# Patient Record
Sex: Female | Born: 1995 | Race: Black or African American | Hispanic: No | Marital: Single | Smoking: Never smoker
Health system: Southern US, Community
[De-identification: ages and names within clinical notes are randomized; demographics above are authoritative.]

## PROBLEM LIST (undated history)

## (undated) DIAGNOSIS — F329 Major depressive disorder, single episode, unspecified: Secondary | ICD-10-CM

## (undated) DIAGNOSIS — F32A Depression, unspecified: Secondary | ICD-10-CM

## (undated) DIAGNOSIS — F909 Attention-deficit hyperactivity disorder, unspecified type: Secondary | ICD-10-CM

## (undated) DIAGNOSIS — F840 Autistic disorder: Secondary | ICD-10-CM

## (undated) DIAGNOSIS — F419 Anxiety disorder, unspecified: Secondary | ICD-10-CM

## (undated) HISTORY — DX: Depression, unspecified: F32.A

## (undated) HISTORY — DX: Autistic disorder: F84.0

## (undated) HISTORY — DX: Major depressive disorder, single episode, unspecified: F32.9

## (undated) HISTORY — DX: Attention-deficit hyperactivity disorder, unspecified type: F90.9

---
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
## 2014-07-02 ENCOUNTER — Emergency Department (HOSPITAL_COMMUNITY): Payer: Medicaid Other

## 2014-07-02 ENCOUNTER — Encounter (HOSPITAL_COMMUNITY): Payer: Self-pay | Admitting: Emergency Medicine

## 2014-07-02 ENCOUNTER — Emergency Department (HOSPITAL_COMMUNITY)
Admission: EM | Admit: 2014-07-02 | Discharge: 2014-07-02 | Disposition: A | Discharge status: Home / Self Care | Payer: Medicaid Other | Attending: Emergency Medicine | Admitting: Emergency Medicine

## 2014-07-02 DIAGNOSIS — F411 Generalized anxiety disorder: Secondary | ICD-10-CM | POA: Insufficient documentation

## 2014-07-02 DIAGNOSIS — IMO0002 Reserved for concepts with insufficient information to code with codable children: Secondary | ICD-10-CM | POA: Insufficient documentation

## 2014-07-02 DIAGNOSIS — Y9389 Activity, other specified: Secondary | ICD-10-CM | POA: Insufficient documentation

## 2014-07-02 DIAGNOSIS — Z79899 Other long term (current) drug therapy: Secondary | ICD-10-CM | POA: Diagnosis not present

## 2014-07-02 DIAGNOSIS — Y929 Unspecified place or not applicable: Secondary | ICD-10-CM | POA: Insufficient documentation

## 2014-07-02 DIAGNOSIS — W010XXA Fall on same level from slipping, tripping and stumbling without subsequent striking against object, initial encounter: Secondary | ICD-10-CM | POA: Insufficient documentation

## 2014-07-02 DIAGNOSIS — X500XXA Overexertion from strenuous movement or load, initial encounter: Secondary | ICD-10-CM | POA: Insufficient documentation

## 2014-07-02 DIAGNOSIS — S99922A Unspecified injury of left foot, initial encounter: Secondary | ICD-10-CM

## 2014-07-02 DIAGNOSIS — S90129A Contusion of unspecified lesser toe(s) without damage to nail, initial encounter: Secondary | ICD-10-CM | POA: Insufficient documentation

## 2014-07-02 DIAGNOSIS — S8990XA Unspecified injury of unspecified lower leg, initial encounter: Secondary | ICD-10-CM | POA: Diagnosis present

## 2014-07-02 MED ORDER — IBUPROFEN 600 MG PO TABS: 600.0000 mg | ORAL_TABLET | Freq: Four times a day (QID) | ORAL | Status: DC | PRN

## 2014-07-02 MED ORDER — OXYCODONE-ACETAMINOPHEN 5-325 MG PO TABS
1.0000 | ORAL_TABLET | Freq: Once | ORAL | Status: AC
  Administered 2014-07-02: 1 via ORAL
  Filled 2014-07-02: qty 1

## 2014-07-02 NOTE — ED Notes (Signed)
Reports tripping and falling last night and "twisted" right foot/big toe. Bruising and swelling noted. Pt ambulatory at triage.

## 2014-07-02 NOTE — ED Provider Notes (Signed)
CSN: [id number]     Arrival date & time 07/02/14  1316 History   This chart was scribed for non-physician practitioner working with Lyanne CoKevin M Campos, MD, by Andrew Auaven Small, ED Scribe. This patient was seen in room TR07C/TR07C and the patient's care was started at 1:27 PM.  Chief Complaint  Patient presents with  . Foot Injury   HPI Comments: Sylvia [name] is a 18 y.o. female who presents to the Emergency Department complaining of right foot pain. Pt states she tripped and twisted her foot last night. Denies other injury, blow to head or LOC. Pt has taken tylenol and ibuprofen without relief to pain.  Reports increase discomfort with ambulation. Pt denies numbness, tingling and weakness to foot. She denies fever and chills.   Patient is a 18 y.o. female presenting with foot injury. The history is provided by the patient. No language interpreter was used.  Foot Injury Associated symptoms: no fever     Past Medical History  Diagnosis Date  . Anxiety    Past Surgical History  Procedure Laterality Date  . Eye surgery     Family History  Problem Relation Age of Onset  . Heart attack Maternal Grandmother   . Lung cancer Paternal Grandmother    History  Substance Use Topics  . Smoking status: Never Smoker   . Smokeless tobacco: Never Used  . Alcohol Use: No   OB History   Grav Para Term Preterm Abortions TAB SAB Ect Mult Living                 Review of Systems  Constitutional: Negative for fever and chills.  Musculoskeletal: Positive for arthralgias, gait problem and myalgias.  Skin: Positive for color change. Negative for wound.  Neurological: Negative for weakness and numbness.  All other systems reviewed and are negative.   Allergies  Review of patient's allergies indicates no known allergies.  Home Medications   Prior to Admission medications   Medication Sig Start Date End Date Taking? Authorizing Provider  cetirizine (ZYRTEC) 10 MG tablet Take 10 mg by mouth daily.     Historical Provider, MD  cyclobenzaprine (FLEXERIL) 10 MG tablet Take 1 tablet (10 mg total) by mouth 2 (two) times daily as needed for muscle spasms. 02/04/13   Chrystine Oileross J Kuhner, MD  escitalopram (LEXAPRO) 20 MG tablet Take 20 mg by mouth daily.    Historical Provider, MD  ibuprofen (ADVIL,MOTRIN) 200 MG tablet Take 600 mg by mouth every 6 (six) hours as needed for pain (for back pain.).    Historical Provider, MD  Multiple Vitamin (MULTIVITAMIN) tablet Take 1 tablet by mouth daily.    Historical Provider, MD  Olopatadine HCl (PATANASE) 0.6 % SOLN Place into the nose.    Historical Provider, MD  triamcinolone (NASACORT) 55 MCG/ACT nasal inhaler Place 2 sprays into the nose daily.    Historical Provider, MD   BP 108/72  Pulse 93  Temp(Src) 98.5 F (36.9 C) (Oral)  Resp 18  SpO2 99%  LMP 06/25/2014 Physical Exam  Nursing note and vitals reviewed. Constitutional: She is oriented to person, place, and time. She appears well-developed and well-nourished.  Non-toxic appearance. She does not have a sickly appearance. She does not appear ill. No distress.  HENT:  Head: Normocephalic and atraumatic.  Neck: Normal range of motion. Neck supple.  Pulmonary/Chest: Effort normal. No respiratory distress.  Musculoskeletal:       Left ankle: She exhibits swelling and ecchymosis. She exhibits normal range of  motion, no deformity, no laceration and normal pulse. No lateral malleolus, no medial malleolus and no AITFL tenderness found. Achilles tendon normal.       Feet:  Minimal ecchymosis to in between the base of 1st and 2nd toes.  Neurological: She is alert and oriented to person, place, and time.  Skin: Skin is warm and dry. She is not diaphoretic.  Psychiatric: She has a normal mood and affect. Her behavior is normal.    ED Course  Procedures  Labs Review Labs Reviewed - No data to display  Imaging Review Dg Foot Complete Left  07/02/2014   CLINICAL DATA:  Pain post trauma  EXAM: LEFT FOOT -  COMPLETE 3+ VIEW  COMPARISON:  None.  FINDINGS: Frontal, oblique, and lateral views were obtained. There is no fracture or dislocation. Joint spaces appear intact. No erosive change.  IMPRESSION: No abnormality noted.   Electronically Signed   By: Bretta BangWilliam  Woodruff M.D.   On: 07/02/2014 13:54     EKG Interpretation None      MDM   Final diagnoses:  Foot injury, left, initial encounter   Pt present with injury to left foot.  Negative XR, N/V intact. RICE therapy, post-op shoe and crutches ordered. Discussed imaging results, and treatment plan with the patient and patient's mother. Return precautions given. Reports understanding and no other concerns at this time.  Patient is stable for discharge at this time.  Meds given in ED:  Medications  oxyCODONE-acetaminophen (PERCOCET/ROXICET) 5-325 MG per tablet 1 tablet (1 tablet Oral Given 07/02/14 1400)    Discharge Medication List as of 07/02/2014  2:54 PM    START taking these medications   Details  ibuprofen (ADVIL,MOTRIN) 600 MG tablet Take 1 tablet (600 mg total) by mouth every 6 (six) hours as needed., Starting 07/02/2014, Until Discontinued, Print       I personally performed the services described in this documentation, which was scribed in my presence. The recorded information has been reviewed and is accurate.      Clabe SealLauren M Camerin Jimenez, PA-C 07/02/14 1742  Clabe SealLauren M Anayely Constantine, PA-C 07/02/14 [phone]

## 2014-07-02 NOTE — Discharge Instructions (Signed)
Call for a follow up appointment with a Family or Primary Care Provider.  Ice your foot 3-4 times a day and elevate it above your heart when you're not walking or standing. Call a orthopedist specialist if you have persistent pain in your foot. Use crutches as needed. Return if Symptoms worsen.   Take medication as prescribed.

## 2014-07-03 NOTE — ED Provider Notes (Signed)
Medical screening examination/treatment/procedure(s) were performed by non-physician practitioner and as supervising physician I was immediately available for consultation/collaboration.   EKG Interpretation None        Lyanne CoKevin M Tacara Hadlock, MD 07/03/14 [phone]

## 2015-07-03 ENCOUNTER — Emergency Department (HOSPITAL_COMMUNITY)
Admission: EM | Admit: 2015-07-03 | Discharge: 2015-07-03 | Disposition: A | Discharge status: Home / Self Care | Payer: Medicaid Other | Attending: Emergency Medicine | Admitting: Emergency Medicine

## 2015-07-03 ENCOUNTER — Encounter (HOSPITAL_COMMUNITY): Payer: Self-pay | Admitting: *Deleted

## 2015-07-03 DIAGNOSIS — L739 Follicular disorder, unspecified: Secondary | ICD-10-CM

## 2015-07-03 DIAGNOSIS — Z79899 Other long term (current) drug therapy: Secondary | ICD-10-CM | POA: Diagnosis not present

## 2015-07-03 DIAGNOSIS — F419 Anxiety disorder, unspecified: Secondary | ICD-10-CM | POA: Diagnosis not present

## 2015-07-03 DIAGNOSIS — Z7951 Long term (current) use of inhaled steroids: Secondary | ICD-10-CM | POA: Diagnosis not present

## 2015-07-03 DIAGNOSIS — R21 Rash and other nonspecific skin eruption: Secondary | ICD-10-CM | POA: Diagnosis present

## 2015-07-03 MED ORDER — CLINDAMYCIN PHOSPHATE 1 % EX GEL: Freq: Two times a day (BID) | CUTANEOUS | Status: DC

## 2015-07-03 NOTE — ED Provider Notes (Signed)
CSN: [id number]     Arrival date & time 07/03/15  1113 History  This chart was scribed for Burna FortsJeff Evangelyn Crouse, PA-C, working with No att. providers found by Elon SpannerGarrett Cook, ED Scribe. This patient was seen in room TR10C/TR10C and the patient's care was started at 5:38 PM.   Chief Complaint  Patient presents with  . Rash   The history is provided by the patient. No language interpreter was used.   HPI Comments: Sylvia [name] is a 19 y.o. female who presents to the Emergency Department complaining of rash or lower extremities and arms. Patient reports that on Friday she shaved her legs, Saturday went to the pool. Patient reports developing pustules to her lower extremities hands and face. She reports they're not it she, not getting worse, not spreading. She denies involvement of the groin and abdomen back and minimal upper extremities. She no fever chills, nausea, vomiting, signs of systemic infection. Patient has not tried any over-the-counter therapies.  Past Medical History  Diagnosis Date  . Anxiety    Past Surgical History  Procedure Laterality Date  . Eye surgery     Family History  Problem Relation Age of Onset  . Heart attack Maternal Grandmother   . Lung cancer Paternal Grandmother    History  Substance Use Topics  . Smoking status: Never Smoker   . Smokeless tobacco: Never Used  . Alcohol Use: No   OB History    No data available     Review of Systems  Skin: Positive for rash.  All other systems reviewed and are negative.   Allergies  Review of patient's allergies indicates no known allergies.  Home Medications   Prior to Admission medications   Medication Sig Start Date End Date Taking? Authorizing Provider  cetirizine (ZYRTEC) 10 MG tablet Take 10 mg by mouth daily.    Historical Provider, MD  clindamycin (CLINDAGEL) 1 % gel Apply topically 2 (two) times daily. 07/03/15   Eyvonne MechanicJeffrey Sherea Liptak, PA-C  escitalopram (LEXAPRO) 20 MG tablet Take 20 mg by mouth at bedtime.      Historical Provider, MD  ibuprofen (ADVIL,MOTRIN) 600 MG tablet Take 1 tablet (600 mg total) by mouth every 6 (six) hours as needed. 07/02/14   Mellody DrownLauren Parker, PA-C  IRON PO Take 1 tablet by mouth 2 (two) times daily.    Historical Provider, MD  Multiple Vitamin (MULTIVITAMIN) tablet Take 1 tablet by mouth daily.    Historical Provider, MD  Olopatadine HCl (PATANASE) 0.6 % SOLN Place 1 puff into both nostrils 2 (two) times daily as needed (congestion).     Historical Provider, MD  triamcinolone (NASACORT) 55 MCG/ACT nasal inhaler Place 2 sprays into both nostrils daily.     Historical Provider, MD   BP 108/63 mmHg  Pulse 63  Temp(Src) 98.4 F (36.9 C) (Oral)  Resp 14  SpO2 100%  LMP 07/03/2015    Physical Exam  Constitutional: She is oriented to person, place, and time. She appears well-developed and well-nourished. No distress.  HENT:  Head: Normocephalic and atraumatic.  Mouth/Throat: Uvula is midline. Normal dentition. No oropharyngeal exudate, posterior oropharyngeal edema, posterior oropharyngeal erythema or tonsillar abscesses.  Eyes: Conjunctivae and EOM are normal.  Neck: Neck supple. No tracheal deviation present.  Cardiovascular: Normal rate.   Pulmonary/Chest: Effort normal. No respiratory distress.  Musculoskeletal: Normal range of motion.  Neurological: She is alert and oriented to person, place, and time.  Skin: Skin is warm and dry.  Multiple pustules to the lower  extremities greater on the anterior surface sparing the bottom of the feet, digital spaces. There are a few pustules to the upper extremities and hands no involvement of the palms. Face has several pustules. No signs of spreading infection.  Psychiatric: She has a normal mood and affect. Her behavior is normal.  Nursing note and vitals reviewed.   ED Course  Procedures (including critical care time)  DIAGNOSTIC STUDIES:   COORDINATION OF CARE:    Labs Review Labs Reviewed - No data to  display  Imaging Review No results found.   EKG Interpretation None      MDM   Final diagnoses:  Folliculitis    Labs:  Imaging:  Therapeutics:  Assessment/Plan: Patient presents with likely folliculitis. Patient has no systemic symptoms, no extensive involvement. She will be placed on topical antibiotic therapy, and instructed to use Benadryl as needed for itching. Strict return precautions given, encouraged to follow up with primary care in 3 days if symptoms do not improve. Patient verbalized understanding and agreement to today's plan, patient's mother was present at the time of evaluation, she also understood and agreed to today's plan. No further questions or concerns at the time of discharge  Discharge Meds: Clindamycin 1% gel  I personally performed the services described in this documentation, which was scribed in my presence. The recorded information has been reviewed and is accurate.    Eyvonne Mechanic, PA-C 07/03/15 1738  Bethann Berkshire, MD 07/04/15 [phone]

## 2015-07-03 NOTE — ED Notes (Signed)
Pt has rash and itching to legs, arms and face.

## 2015-07-03 NOTE — Discharge Instructions (Signed)
Please use topical antibiotic on affected areas twice daily for 7 days. Please follow up with her primary care provider if symptoms persist beyond that. Monitor or for signs of infection return immediately

## 2017-05-19 ENCOUNTER — Ambulatory Visit (HOSPITAL_COMMUNITY)
Admission: EM | Admit: 2017-05-19 | Discharge: 2017-05-19 | Disposition: A | Discharge status: Home / Self Care | Payer: Medicaid Other | Attending: Internal Medicine | Admitting: Internal Medicine

## 2017-05-19 ENCOUNTER — Encounter (HOSPITAL_COMMUNITY): Payer: Self-pay | Admitting: *Deleted

## 2017-05-19 DIAGNOSIS — R42 Dizziness and giddiness: Secondary | ICD-10-CM | POA: Diagnosis not present

## 2017-05-19 DIAGNOSIS — Z3202 Encounter for pregnancy test, result negative: Secondary | ICD-10-CM | POA: Diagnosis not present

## 2017-05-19 LAB — POCT URINALYSIS DIP (DEVICE)
Bilirubin Urine: NEGATIVE
GLUCOSE, UA: NEGATIVE mg/dL
Hgb urine dipstick: NEGATIVE
Nitrite: NEGATIVE
Protein, ur: NEGATIVE mg/dL
Specific Gravity, Urine: 1.025 (ref 1.005–1.030)
UROBILINOGEN UA: 0.2 mg/dL (ref 0.0–1.0)
pH: 5.5 (ref 5.0–8.0)

## 2017-05-19 LAB — POCT I-STAT, CHEM 8
BUN: 17 mg/dL (ref 6–20)
CALCIUM ION: 1.27 mmol/L (ref 1.15–1.40)
CHLORIDE: 104 mmol/L (ref 101–111)
Creatinine, Ser: 0.8 mg/dL (ref 0.44–1.00)
GLUCOSE: 53 mg/dL — AB (ref 65–99)
HCT: 42 % (ref 36.0–46.0)
HEMOGLOBIN: 14.3 g/dL (ref 12.0–15.0)
Potassium: 3.7 mmol/L (ref 3.5–5.1)
Sodium: 139 mmol/L (ref 135–145)
TCO2: 27 mmol/L (ref 0–100)

## 2017-05-19 LAB — POCT PREGNANCY, URINE: PREG TEST UR: NEGATIVE

## 2017-05-19 MED ORDER — MECLIZINE HCL 25 MG PO TABS: 25.0000 mg | ORAL_TABLET | Freq: Three times a day (TID) | ORAL | 0 refills | Status: DC | PRN

## 2017-05-19 NOTE — Discharge Instructions (Signed)
Eat and drink regularly,  Follow up with your primary care doctor

## 2017-05-19 NOTE — ED Notes (Signed)
Patient unable to void at this time. Patient felt as though she was going to vomit, provided emesis bag

## 2017-05-19 NOTE — ED Triage Notes (Addendum)
Pt  Reports   Symptoms  Of  dizzyness     As   Well  As  Ringing  In  Her  Ears  With  Nausea   As  Well      Pt  Sitting  Upright on the  Exam table  Speaking  In  Complete  sentances   And  Is  In no  Acute  Distress     Skin is  Warm  And  Dry    She reports    Being  Weak  As   Well

## 2017-05-19 NOTE — ED Provider Notes (Signed)
CSN: [id number]     Arrival date & time 05/19/17  1004 History   None    Chief Complaint  Patient presents with  . Dizziness   (Consider location/radiation/quality/duration/timing/severity/associated sxs/prior Treatment) The history is provided by the patient. No language interpreter was used.  Dizziness  Quality:  Lightheadedness Severity:  Mild Onset quality:  Gradual Duration:  3 days Timing:  Constant Progression:  Unchanged Chronicity:  New Relieved by:  Nothing Worsened by:  Nothing Ineffective treatments:  None tried Associated symptoms: no weakness   Pt complains of feeling dizzy  Past Medical History:  Diagnosis Date  . Anxiety    Past Surgical History:  Procedure Laterality Date  . EYE SURGERY     Family History  Problem Relation Age of Onset  . Heart attack Maternal Grandmother   . Lung cancer Paternal Grandmother    Social History  Substance Use Topics  . Smoking status: Never Smoker  . Smokeless tobacco: Never Used  . Alcohol use No   OB History    No data available     Review of Systems  Neurological: Positive for dizziness. Negative for weakness.  All other systems reviewed and are negative.   Allergies  Patient has no known allergies.  Home Medications   Prior to Admission medications   Medication Sig Start Date End Date Taking? Authorizing Provider  cetirizine (ZYRTEC) 10 MG tablet Take 10 mg by mouth daily.    [provider]  clindamycin (CLINDAGEL) 1 % gel Apply topically 2 (two) times daily. 07/03/15   Hedges, Tinnie GensJeffrey, PA-C  escitalopram (LEXAPRO) 20 MG tablet Take 20 mg by mouth at bedtime.     [provider]  ibuprofen (ADVIL,MOTRIN) 600 MG tablet Take 1 tablet (600 mg total) by mouth every 6 (six) hours as needed. 07/02/14   Mellody DrownParker, Lauren, PA-C  IRON PO Take 1 tablet by mouth 2 (two) times daily.    [provider]  meclizine (ANTIVERT) 25 MG tablet Take 1 tablet (25 mg total) by mouth 3 (three) times  daily as needed for dizziness. 05/19/17   Elson AreasSofia, Steed Kanaan K, PA-C  Multiple Vitamin (MULTIVITAMIN) tablet Take 1 tablet by mouth daily.    [provider]  Olopatadine HCl (PATANASE) 0.6 % SOLN Place 1 puff into both nostrils 2 (two) times daily as needed (congestion).     [provider]  triamcinolone (NASACORT) 55 MCG/ACT nasal inhaler Place 2 sprays into both nostrils daily.     [provider]   Meds Ordered and Administered this Visit  Medications - No data to display  BP 112/72 (BP Location: Right Arm)   Pulse 78   Temp 98.6 F (37 C) (Oral)   Resp 18   SpO2 100%  No data found.   Physical Exam  Constitutional: She appears well-developed and well-nourished. No distress.  HENT:  Head: Normocephalic and atraumatic.  Eyes: Conjunctivae are normal.  Neck: Neck supple.  Cardiovascular: Normal rate and regular rhythm.   No murmur heard. Pulmonary/Chest: Effort normal and breath sounds normal. No respiratory distress.  Abdominal: Soft. There is no tenderness.  Musculoskeletal: She exhibits no edema.  Neurological: She is alert.  Skin: Skin is warm and dry.  Psychiatric: She has a normal mood and affect.  Nursing note and vitals reviewed.   Urgent Care Course     Procedures (including critical care time)  Labs Review Labs Reviewed  POCT I-STAT, CHEM 8 - Abnormal; Notable for the following:  Result Value   Glucose, Bld 53 (*)    All other components within normal limits  POCT URINALYSIS DIP (DEVICE) - Abnormal; Notable for the following:    Ketones, ur TRACE (*)    Leukocytes, UA TRACE (*)    All other components within normal limits  POCT PREGNANCY, URINE    Imaging Review No results found.   Visual Acuity Review  Right Eye Distance:   Left Eye Distance:   Bilateral Distance:    Right Eye Near:   Left Eye Near:    Bilateral Near:         MDM ot given sprite due to low glucose.   I will treat with antivert.  Pt advised  to make sure she eats and drinks normally   1. Dizziness    Meds ordered this encounter  Medications  . meclizine (ANTIVERT) 25 MG tablet    Sig: Take 1 tablet (25 mg total) by mouth 3 (three) times daily as needed for dizziness.    Dispense:  30 tablet    Refill:  0    Order Specific Question:   Supervising Provider    Answer:   Eustace Moore [[id number]]   An After Visit Summary was printed and given to the patient.    Elson Areas, [place] 05/19/17 1344

## 2017-06-08 ENCOUNTER — Encounter (HOSPITAL_COMMUNITY): Payer: Self-pay | Admitting: Emergency Medicine

## 2017-06-08 ENCOUNTER — Ambulatory Visit (HOSPITAL_COMMUNITY)
Admission: EM | Admit: 2017-06-08 | Discharge: 2017-06-08 | Disposition: A | Discharge status: Home / Self Care | Payer: Medicaid Other | Attending: Internal Medicine | Admitting: Internal Medicine

## 2017-06-08 DIAGNOSIS — R42 Dizziness and giddiness: Secondary | ICD-10-CM | POA: Diagnosis not present

## 2017-06-08 MED ORDER — MECLIZINE HCL 25 MG PO TABS: 25.0000 mg | ORAL_TABLET | Freq: Three times a day (TID) | ORAL | 0 refills | Status: DC | PRN

## 2017-06-08 NOTE — ED Provider Notes (Signed)
CSN: [id number]     Arrival date & time 06/08/17  1329 History   First MD Initiated Contact with Patient 06/08/17 1359     Chief Complaint  Patient presents with  . Dizziness  . Emesis   (Consider location/radiation/quality/duration/timing/severity/associated sxs/prior Treatment) 21 year old female accompanied by her mother complaining of dizziness that started about 2-3 weeks ago. She presented to the urgent care at that time and prescribed meclizine. She states the meclizine has prevented her from being dizzy. She ran out of medicine several days ago when she is dizzy again and has had vomiting in the past couple of days. She was told she had vertigo on her first visit. Denies head injury, headache or other neurologic deficits.      Past Medical History:  Diagnosis Date  . Anxiety    Past Surgical History:  Procedure Laterality Date  . EYE SURGERY     Family History  Problem Relation Age of Onset  . Heart attack Maternal Grandmother   . Lung cancer Paternal Grandmother    Social History  Substance Use Topics  . Smoking status: Never Smoker  . Smokeless tobacco: Never Used  . Alcohol use No   OB History    No data available     Review of Systems  Constitutional: Negative.   Gastrointestinal: Positive for nausea and vomiting.  Musculoskeletal: Negative.   Neurological: Positive for dizziness. Negative for tremors, seizures, syncope, facial asymmetry, speech difficulty, light-headedness, numbness and headaches.  All other systems reviewed and are negative.   Allergies  Patient has no known allergies.  Home Medications   Prior to Admission medications   Medication Sig Start Date End Date Taking? Authorizing Provider  cetirizine (ZYRTEC) 10 MG tablet Take 10 mg by mouth daily.   Yes [provider]  cloNIDine (CATAPRES) 0.1 MG tablet Take 0.1 mg by mouth 2 (two) times daily. Take 1/2 tablet   Yes [provider]  escitalopram (LEXAPRO) 20 MG  tablet Take 20 mg by mouth at bedtime.    Yes [provider]  clindamycin (CLINDAGEL) 1 % gel Apply topically 2 (two) times daily. 07/03/15   Hedges, Tinnie Gens, PA-C  ibuprofen (ADVIL,MOTRIN) 600 MG tablet Take 1 tablet (600 mg total) by mouth every 6 (six) hours as needed. 07/02/14   Mellody Drown, PA-C  IRON PO Take 1 tablet by mouth 2 (two) times daily.    [provider]  meclizine (ANTIVERT) 25 MG tablet Take 1 tablet (25 mg total) by mouth 3 (three) times daily as needed for dizziness. 06/08/17   Hayden Rasmussen, NP  Multiple Vitamin (MULTIVITAMIN) tablet Take 1 tablet by mouth daily.    [provider]  Olopatadine HCl (PATANASE) 0.6 % SOLN Place 1 puff into both nostrils 2 (two) times daily as needed (congestion).     [provider]  triamcinolone (NASACORT) 55 MCG/ACT nasal inhaler Place 2 sprays into both nostrils daily.     [provider]   Meds Ordered and Administered this Visit  Medications - No data to display  BP 105/70 (BP Location: Right Arm)   Pulse 99   Temp 98.9 F (37.2 C) (Oral)   LMP 05/26/2017 (Approximate)   SpO2 98%  No data found.   Physical Exam  Constitutional: She is oriented to person, place, and time. She appears well-developed and well-nourished.  HENT:  Head: Normocephalic and atraumatic.  Mouth/Throat: Oropharynx is clear and moist.   soft plate rises symmetrically.  uvula and tongue midline.  Eyes: EOM are normal. Pupils are equal, round, and reactive to light.  Neck: Normal range of motion. Neck supple.  Cardiovascular: Normal rate, regular rhythm and normal heart sounds.   Pulmonary/Chest: Effort normal and breath sounds normal.  Neurological: She is alert and oriented to person, place, and time. She has normal strength. No cranial nerve deficit or sensory deficit. She displays a negative Romberg sign. Coordination normal. GCS eye subscore is 4. GCS verbal subscore is 5. GCS motor subscore is 6.  Psychiatric:  She has a normal mood and affect. Her behavior is normal.  Nursing note and vitals reviewed.   Urgent Care Course     Procedures (including critical care time)  Labs Review Labs Reviewed - No data to display  Imaging Review No results found.   Visual Acuity Review  Right Eye Distance:   Left Eye Distance:   Bilateral Distance:    Right Eye Near:   Left Eye Near:    Bilateral Near:         MDM   1. Dizziness    Take medication as directed. If you develop new symptoms or problems or worsening sick medical attention promptly. Meds ordered this encounter  Medications  . cloNIDine (CATAPRES) 0.1 MG tablet    Sig: Take 0.1 mg by mouth 2 (two) times daily. Take 1/2 tablet  . meclizine (ANTIVERT) 25 MG tablet    Sig: Take 1 tablet (25 mg total) by mouth 3 (three) times daily as needed for dizziness.    Dispense:  30 tablet    Refill:  0    Order Specific Question:   Supervising Provider    Answer:   Eustace MooreMURRAY, LAURA W [[id number]]       Hayden RasmussenMabe, Valera Vallas, NP 06/08/17 1415

## 2017-06-08 NOTE — Discharge Instructions (Signed)
Take medication as directed. If you develop new symptoms or problems or worsening sick medical attention promptly.

## 2017-06-08 NOTE — ED Triage Notes (Signed)
Pt reports felling dizzy since she ran out of her Meclizine 10 days ago.  On Tuesday and Wednesday she reports 2 episodes a day of vomiting.

## 2018-10-02 ENCOUNTER — Encounter: Payer: Self-pay | Admitting: Internal Medicine

## 2018-10-02 ENCOUNTER — Ambulatory Visit (INDEPENDENT_AMBULATORY_CARE_PROVIDER_SITE_OTHER): Payer: Medicare Other | Admitting: Internal Medicine

## 2018-10-02 VITALS — BP 118/82 | HR 80 | Resp 12 | Ht 62.0 in | Wt 132.0 lb

## 2018-10-02 DIAGNOSIS — F840 Autistic disorder: Secondary | ICD-10-CM | POA: Insufficient documentation

## 2018-10-02 DIAGNOSIS — F329 Major depressive disorder, single episode, unspecified: Secondary | ICD-10-CM | POA: Insufficient documentation

## 2018-10-02 DIAGNOSIS — Z9109 Other allergy status, other than to drugs and biological substances: Secondary | ICD-10-CM

## 2018-10-02 DIAGNOSIS — F32A Depression, unspecified: Secondary | ICD-10-CM | POA: Insufficient documentation

## 2018-10-02 MED ORDER — FLUTICASONE PROPIONATE 50 MCG/ACT NA SUSP: 2.0000 | Freq: Every day | NASAL | 11 refills | Status: DC

## 2018-10-02 MED ORDER — DESLORATADINE 5 MG PO TABS: 5.0000 mg | ORAL_TABLET | Freq: Every day | ORAL | 11 refills | Status: DC

## 2018-10-02 NOTE — Progress Notes (Signed)
Subjective:    Patient ID: Sylvia [name], female    DOB: 10-23-1996, 23 y.o.   MRN: [id number]  HPI  Here to establish  Sylvia [name] and Sylvia [name] both patients here.  1.  Allergies:  Problem since a child.  Stuffy itchy nose.  No rhinorrhea.  No sneezing  Eyes nor throat itches.  No [name] watering.   Her symptoms are year round. Gets worse during winter months.   Wall to wall carpeting.   Does not have hypoallergenic covers for mattress or pillows.   Does not vacuum or dust regularly.    Had used Clarinex in the past with good luck.  Has used Flonase in the past with good luck.  She is willing to use regularly.  Later:  Was using Zyrtec and Nasonex most recently and that works for her.  Has not used meds for some time  2.  Depression, stress, Autism spectrum:   Dr. Bonita [name] at [facility] [place].  Has been working with them for at least 5 years since in high school--graduated 2014. She feels her medication mix works well for her.   Clonidine, Escitalopram,   Current Meds  Medication Sig  . cetirizine (ZYRTEC) 10 MG tablet Take 10 mg by mouth daily.  . cloNIDine (CATAPRES) 0.1 MG tablet Take 0.1 mg by mouth 2 (two) times daily. Take 1/2 tablet  . escitalopram (LEXAPRO) 20 MG tablet Take 20 mg by mouth at bedtime.   . triamcinolone (NASACORT) 55 MCG/ACT nasal inhaler Place 2 sprays into both nostrils daily.   . [DISCONTINUED] ibuprofen (ADVIL,MOTRIN) 600 MG tablet Take 1 tablet (600 mg total) by mouth every 6 (six) hours as needed.    No Known Allergies   Past Medical History:  Diagnosis Date  . Anxiety   . Autism spectrum    High functioning  . Depression     Past Surgical History:  Procedure Laterality Date  . [name] SURGERY  2001   strabismus corrective surgery  . WISDOM TOOTH EXTRACTION  2012    Family History  Problem Relation Age of Onset  . Hyperlipidemia Father   . Diabetes Father        prediabetes  . Anxiety disorder Sister   . Asthma Brother   . Heart attack  Maternal Grandmother   . Lung cancer Paternal Grandmother     Social History   Socioeconomic History  . Marital status: Single    Spouse name: Not on file  . Number of children: Not on file  . Years of education: Not on file  . Highest education level: Not on file  Occupational History  . Not on file  Social Needs  . Financial resource strain: Not on file  . Food insecurity:    Worry: Never true    Inability: Never true  . Transportation needs:    Medical: No    Non-medical: No  Tobacco Use  . Smoking status: Never Smoker  . Smokeless tobacco: Never Used  Substance and Sexual Activity  . Alcohol use: No  . Drug use: No  . Sexual activity: Never  Lifestyle  . Physical activity:    Days per week: Not on file    Minutes per session: Not on file  . Stress: Not at all  Relationships  . Social connections:    Talks on phone: Not on file    Gets together: Not on file    Attends religious service: Not on file    Active member of  club or organization: Not on file    Attends meetings of clubs or organizations: Not on file    Relationship status: Not on file  . Intimate partner violence:    Fear of current or ex partner: Not on file    Emotionally abused: No    Physically abused: No    Forced sexual activity: Not on file  Other Topics Concern  . Not on file  Social History Narrative   Lives with her parents.     Review of Systems     Objective:   Physical Exam NAD Poor [name] contact and social interaction HEENT:  PERRL, EOMI, TMs pearly gray, throat with cobbling.  Nasal mucosa swollen and erythematous, more so on left. Neck:  Supple, No adenopathy Chest:  CTA CV: RRR with normal S1 and S2, No S3, S4  Or murmur.  Radial and DP pulses normal and equal Abd:  S, NT, No HS or mass, + BS      Assessment & Plan:  1.  Environmental allergies:  See patient instructions for keeping dust mites/dust down. Clarinex 5 mg daily and Nasonex 2 sprays each nostril daily. To  call if not covered.  2.  Autism Spectrum and related behavioral issues:  Followed at [facility].  To schedule a physical.

## 2018-10-02 NOTE — Patient Instructions (Signed)
Needs Td and influenza vaccines at [facility].

## 2018-10-02 NOTE — Progress Notes (Signed)
LCSW completed new patient screening with patient in order to assess for mental health concerns and/or problems with social determinants of health (food, housing, transportation, interpersonal violence). Patient reported that she is not experiencing any significant mental health symptoms currently. She shared that she already sees a therapist regularly at [facility] [place]. Patient reported that she does not have any issues with SDOH at this time.  No identified needs, no follow up needed at this time.

## 2018-11-09 ENCOUNTER — Encounter: Payer: Medicare Other | Admitting: Internal Medicine

## 2018-12-11 ENCOUNTER — Ambulatory Visit: Payer: Medicare Other | Admitting: Internal Medicine

## 2018-12-11 ENCOUNTER — Encounter: Payer: Self-pay | Admitting: Internal Medicine

## 2018-12-11 VITALS — BP 118/80 | HR 70 | Resp 12 | Ht 62.0 in | Wt 126.0 lb

## 2018-12-11 DIAGNOSIS — Z9109 Other allergy status, other than to drugs and biological substances: Secondary | ICD-10-CM | POA: Diagnosis not present

## 2018-12-11 DIAGNOSIS — Z23 Encounter for immunization: Secondary | ICD-10-CM

## 2018-12-11 DIAGNOSIS — Z124 Encounter for screening for malignant neoplasm of cervix: Secondary | ICD-10-CM

## 2018-12-11 DIAGNOSIS — Z0000 Encounter for general adult medical examination without abnormal findings: Secondary | ICD-10-CM | POA: Diagnosis not present

## 2018-12-11 NOTE — Progress Notes (Signed)
Subjective:   Patient ID: Sylvia [name], female    DOB: 1996/03/06, 22 y.o.   MRN: [id number]  HPI   CPE with pap  1.  Pap:  Never.  No family of cervical cancer.  2.  Mammogram:  Never.  Maternal aunt with history of breast cancer diagnosed at age 73.  She was postmenopausal at diagnosis.  3.  Osteoprevention:  Drinks 1 cup of milk daily.  Not physically active.  No family history of osteoporosis.  4.  Guaiac Cards: Never.  5.  Colonoscopy:  Never.  No family history of colon cancer.  6.  Immunizations:  Due for Td and influenza.  7.  Glucose/Cholesterol:  Never told her blood glucose or cholesterol is high.  Current Meds  Medication Sig  . cloNIDine (CATAPRES) 0.1 MG tablet Take 0.1 mg by mouth 2 (two) times daily. Take 1/2 tablet  . desloratadine (CLARINEX) 5 MG tablet Take 1 tablet (5 mg total) by mouth daily.  Marland Kitchen escitalopram (LEXAPRO) 20 MG tablet Take 20 mg by mouth at bedtime.   . triamcinolone (NASACORT) 55 MCG/ACT nasal inhaler Place 2 sprays into both nostrils daily.   . [DISCONTINUED] fluticasone (FLONASE) 50 MCG/ACT nasal spray Place 2 sprays into both nostrils daily.   No Known Allergies  Past Medical History:  Diagnosis Date  . Anxiety    Sylvia [name] is her counselor at [facility] [place].  Sylvia [name] prescribes her mental health meds.  . Autism spectrum    High functioning  . Depression    Sylvia [name] at [facility] [place]   Past Surgical History:  Procedure Laterality Date  . [name] SURGERY  2001   strabismus corrective surgery  . WISDOM TOOTH EXTRACTION  2012   Family History  Problem Relation Age of Onset  . Hyperlipidemia Father   . Diabetes Father        prediabetes  . Anxiety disorder Sister   . Asthma Brother   . Heart attack Maternal Grandmother   . Lung cancer Paternal Grandmother      Social History   Socioeconomic History  . Marital status: Single    Spouse name: Not on file  . Number of children: 0  . Years of  education: Not on file  . Highest education level: High school graduate  Occupational History  . Not on file  Social Needs  . Financial resource strain: Not on file  . Food insecurity:    Worry: Never true    Inability: Never true  . Transportation needs:    Medical: No    Non-medical: No  Tobacco Use  . Smoking status: Never Smoker  . Smokeless tobacco: Never Used  Substance and Sexual Activity  . Alcohol use: No  . Drug use: No  . Sexual activity: Never  Lifestyle  . Physical activity:    Days per week: Not on file    Minutes per session: Not on file  . Stress: Not at all  Relationships  . Social connections:    Talks on phone: Not on file    Gets together: Not on file    Attends religious service: Not on file    Active member of club or organization: Not on file    Attends meetings of clubs or organizations: Not on file    Relationship status: Not on file  . Intimate partner violence:    Fear of current or ex partner: Not on file    Emotionally abused: No  Physically abused: No    Forced sexual activity: Not on file  Other Topics Concern  . Not on file  Social History Narrative   Lives with her parents.        Review of Systems  Constitutional: Negative for appetite change, fatigue and fever.  HENT: Negative for dental problem, ear pain, hearing loss, rhinorrhea, sinus pressure and sneezing.   Eyes: Negative for visual disturbance (Astigmatism and myopia.  Glasses are good.).  Respiratory: Negative for shortness of breath.   Cardiovascular: Negative for chest pain, palpitations and leg swelling.  Gastrointestinal: Negative for abdominal pain, blood in stool (No melena), constipation and diarrhea.  Genitourinary: Negative for dysuria and menstrual problem (Periods regular.  Does have heavy periods at times.  ).  Musculoskeletal: Negative for arthralgias.  Neurological: Positive for headaches. Negative for weakness and numbness.  Psychiatric/Behavioral:        Feels her depression and anxiety symptoms are well controlled.        Objective:  Physical Exam Constitutional:      Appearance: She is normal weight.  HENT:     Head: Normocephalic and atraumatic.     Right Ear: Hearing, tympanic membrane, ear canal and external ear normal.     Left Ear: Hearing, tympanic membrane, ear canal and external ear normal.     Nose:     Comments: Snorting, left nostril with clear discharge and swollen.    Mouth/Throat:     Lips: Pink.     Mouth: Mucous membranes are moist.     Pharynx: Uvula midline.     Comments: cobbling post pharynx Eyes:     General: Gaze aligned appropriately.     Extraocular Movements: Extraocular movements intact.     Conjunctiva/sclera: Conjunctivae normal.     Pupils: Pupils are equal, round, and reactive to light.     Funduscopic exam:    Right [name]: No papilledema. Red reflex present.        Left [name]: No papilledema. Red reflex present. Neck:     Musculoskeletal: Full passive range of motion without pain, normal range of motion and neck supple.     Thyroid: No thyroid mass or thyromegaly.  Cardiovascular:     Rate and Rhythm: Normal rate and regular rhythm.     Heart sounds: S1 normal and S2 normal. No murmur. No friction rub. No S3 or S4 sounds.      Comments: Carotid, radial, femoral, DP and PT pulses normal and equal.  Pulmonary:     [place]: Pulmonary effort is normal.     Breath sounds: Normal breath sounds.  Chest:     Breasts:        Right: No mass, nipple discharge or skin change.        Left: No mass, nipple discharge or skin change.  Abdominal:     General: Bowel sounds are normal.     Palpations: Abdomen is soft. There is no hepatomegaly, splenomegaly or mass.     Tenderness: There is no abdominal tenderness.     Hernia: No hernia is present.  Genitourinary:    Comments: Normal external female genitalia.  No cervical lesion.  No uterine or adnexal mass or tenderness. Musculoskeletal: Normal range of  motion.     Comments: Accented lumbar curvature with mild kyphosis and mild lower thoracic scoliosis with minimal right hump  Lymphadenopathy:     Head:     Right side of head: No submental or submandibular adenopathy.  Left side of head: No submental or submandibular adenopathy.     Cervical: No cervical adenopathy.     Upper Body:     Right upper body: No supraclavicular or axillary adenopathy.     Left upper body: No supraclavicular or axillary adenopathy.     Lower Body: No right inguinal adenopathy. No left inguinal adenopathy.  Skin:    General: Skin is warm.     Capillary Refill: Capillary refill takes less than 2 seconds.     Findings: No rash.  Neurological:     Mental Status: She is alert.     Cranial Nerves: Cranial nerves are intact.     Sensory: Sensation is intact.     Motor: Motor function is intact.     Coordination: Coordination is intact.     Gait: Gait is intact.     Deep Tendon Reflexes: Reflexes are normal and symmetric.  Psychiatric:        Mood and Affect: Affect is blunt and flat.     Comments: Does not pick up social cues.  Poor [name] contact. Speech is flat and without variation.        Assessment & Plan:  1.  CPE with pap Influenza vaccine. Healthy lifestyle goals including diet and regular physical activity discussed Return for fasting labs in 2 weeks:  FLP, CMP, CBC  2.  Allergies:  Encouraged nasal corticosteroids/Nasonex daily.  Clarinex 5 mg daily as well. Discussed hypoallergenic pillow and mattress covers, vacuuming carpet and washing bedclothes.  3.  Autism spectrum:  As per psych/Family Services.

## 2018-12-11 NOTE — Patient Instructions (Signed)

## 2018-12-13 LAB — CYTOLOGY - PAP

## 2019-12-16 ENCOUNTER — Other Ambulatory Visit: Payer: Self-pay

## 2020-07-14 ENCOUNTER — Ambulatory Visit (INDEPENDENT_AMBULATORY_CARE_PROVIDER_SITE_OTHER): Payer: Medicare Other

## 2020-07-14 ENCOUNTER — Encounter (HOSPITAL_COMMUNITY): Payer: Self-pay

## 2020-07-14 ENCOUNTER — Ambulatory Visit (HOSPITAL_COMMUNITY)
Admission: EM | Admit: 2020-07-14 | Discharge: 2020-07-14 | Disposition: A | Discharge status: Home / Self Care | Payer: Medicare Other | Attending: Family Medicine | Admitting: Family Medicine

## 2020-07-14 ENCOUNTER — Other Ambulatory Visit: Payer: Self-pay

## 2020-07-14 DIAGNOSIS — M79674 Pain in right toe(s): Secondary | ICD-10-CM

## 2020-07-14 MED ORDER — NAPROXEN 500 MG PO TABS: 500.0000 mg | ORAL_TABLET | Freq: Two times a day (BID) | ORAL | 0 refills | Status: DC

## 2020-07-14 NOTE — ED Triage Notes (Signed)
Pt presents with pain and bruising in the right big toe x 1 months. Worse when walking. Denies injury, new shoes. Pt not taking any medication for the chief complaint.

## 2020-07-14 NOTE — ED Provider Notes (Addendum)
[facility]    CSN: [id number] Arrival date & time: 07/14/20  1418      History   Chief Complaint Chief Complaint  Patient presents with  . Toe Pain    HPI Sylvia [name] is a 24 y.o. female presenting today for evaluation of toe pain. Patient has had persistent right great toe pain for 1-2 months. Denies any injury fall or trauma. She has noticed some discoloration at times and some bruising. Has pain with weightbearing. Has felt slightly swollen. Has used some aspirin and Tylenol for pain. Denies history of prior injury or problems with foot/toe.  HPI  Past Medical History:  Diagnosis Date  . Anxiety    [name] is her counselor at [facility] [place].  [name] prescribes her mental health meds.  . Autism spectrum    High functioning  . Depression    [name] at [facility] the [facility]    Patient Active Problem List   Diagnosis Date Noted  . Environmental allergies 10/02/2018  . Depression   . Autism spectrum   . Autism spectrum disorder 12/23/2013  . Anxiety state, unspecified 12/23/2013  . Behavior disturbance 12/23/2013  . Outbursts of anger 12/23/2013  . Tremor 12/23/2013    Past Surgical History:  Procedure Laterality Date  . EYE SURGERY  2001   strabismus corrective surgery  . WISDOM TOOTH EXTRACTION  2012    OB History   No obstetric history on file.      Home Medications    Prior to Admission medications   Medication Sig Start Date End Date Taking? Authorizing Provider  cloNIDine (CATAPRES) 0.1 MG tablet Take 0.1 mg by mouth 2 (two) times daily. Take 1/2 tablet    [provider]  desloratadine (CLARINEX) 5 MG tablet Take 1 tablet (5 mg total) by mouth daily. 10/02/18   Julieanne Manson, MD  escitalopram (LEXAPRO) 20 MG tablet Take 20 mg by mouth at bedtime.     [provider]  IRON PO Take 1 tablet by mouth 2 (two) times daily.    [provider]  Multiple Vitamin (MULTIVITAMIN) tablet Take 1  tablet by mouth daily.    [provider]  naproxen (NAPROSYN) 500 MG tablet Take 1 tablet (500 mg total) by mouth 2 (two) times daily. 07/14/20   Floy Riegler C, PA-C  triamcinolone (NASACORT) 55 MCG/ACT nasal inhaler Place 2 sprays into both nostrils daily.     [provider]    Family History Family History  Problem Relation Age of Onset  . Hyperlipidemia Father   . Diabetes Father        prediabetes  . Anxiety disorder Sister   . Asthma Brother   . Heart attack Maternal Grandmother   . Lung cancer Paternal Grandmother     Social History Social History   Tobacco Use  . Smoking status: Never Smoker  . Smokeless tobacco: Never Used  Vaping Use  . Vaping Use: Never used  Substance Use Topics  . Alcohol use: No  . Drug use: No     Allergies   Patient has no known allergies.   Review of Systems Review of Systems  Constitutional: Negative for fatigue and fever.  Eyes: Negative for visual disturbance.  Respiratory: Negative for shortness of breath.   Cardiovascular: Negative for chest pain.  Gastrointestinal: Negative for abdominal pain, nausea and vomiting.  Musculoskeletal: Positive for arthralgias, gait problem and joint swelling.  Skin: Positive for color change. Negative for rash  and wound.  Neurological: Negative for dizziness, weakness, light-headedness and headaches.     Physical Exam Triage Vital Signs ED Triage Vitals  Enc Vitals Group     BP 07/14/20 1446 108/66     Pulse Rate 07/14/20 1446 82     Resp 07/14/20 1446 17     Temp 07/14/20 1446 98.8 F (37.1 C)     Temp Source 07/14/20 1446 Oral     SpO2 07/14/20 1446 100 %     Weight --      Height --      Head Circumference --      Peak Flow --      Pain Score 07/14/20 1444 8     Pain Loc --      Pain Edu? --      Excl. in GC? --    No data found.  Updated Vital Signs BP 108/66 (BP Location: Right Arm)   Pulse 82   Temp 98.8 F (37.1 C) (Oral)   Resp 17   LMP   (Within Months) Comment: 1 month  SpO2 100%   Visual Acuity Right Eye Distance:   Left Eye Distance:   Bilateral Distance:    Right Eye Near:   Left Eye Near:    Bilateral Near:     Physical Exam Vitals and nursing note reviewed.  Constitutional:      Appearance: She is well-developed.     Comments: No acute distress  HENT:     Head: Normocephalic and atraumatic.     Nose: Nose normal.  Eyes:     Conjunctiva/sclera: Conjunctivae normal.  Cardiovascular:     Rate and Rhythm: Normal rate.  Pulmonary:     [place]: Pulmonary effort is normal. No respiratory distress.  Abdominal:     General: There is no distension.  Musculoskeletal:        General: Normal range of motion.     Cervical back: Neck supple.     Comments: Right foot: No obvious swelling or discoloration, tender to palpation to distal first metatarsal/phalangeal joint of great toe; foot appears slightly more erythematous/purple compared to left foot, dorsalis pedis 2+ cap refill brisk  No swelling or pain noted more proximally in calf or throughout dorsum of foot, nontender to bilateral malleoli Full active range of motion at ankle  Skin:    General: Skin is warm and dry.  Neurological:     Mental Status: She is alert and oriented to person, place, and time.      UC Treatments / Results  Labs (all labs ordered are listed, but only abnormal results are displayed) Labs Reviewed - No data to display  EKG   Radiology DG Foot Complete Right  Result Date: 07/14/2020 CLINICAL DATA:  Two-month history of pain at the MTP joint of the great toe. EXAM: RIGHT FOOT COMPLETE - 3+ VIEW COMPARISON:  None. FINDINGS: There is no evidence of fracture or dislocation. There is no evidence of arthropathy or other focal bone abnormality. Soft tissues are unremarkable. IMPRESSION: Negative. Electronically Signed   By: Paulina Fusi M.D.   On: 07/14/2020 16:10    Procedures Procedures (including critical care  time)  Medications Ordered in UC Medications - No data to display  Initial Impression / Assessment and Plan / UC Course  I have reviewed the triage vital signs and the nursing notes.  Pertinent labs & imaging results that were available during my care of the patient were reviewed by me and considered in  my medical decision making (see chart for details).     X-ray negative for acute bony abnormality.  No sign of cellulitis, paronychia or gout.  Recommending consistent use of NSAIDs over the next 1 to 2 weeks ice and elevation.  Continue to monitor.  Patient does have some mild discoloration to entire foot compared to left, no obvious swelling or tenderness to palpation outside of MTP joint of great toe.  Do not suspect DVT or other circulation problem at this time.  Advised to continue to monitor,Discussed strict return precautions. Patient verbalized understanding and is agreeable with plan.  Final Clinical Impressions(s) / UC Diagnoses   Final diagnoses:  Great toe pain, right     Discharge Instructions     Xray normal  Please use naprosyn twice daily with food consistently over the next 7-10 days Follow up if not improving or worsening, developing increased swelling/discoloration    ED Prescriptions    Medication Sig Dispense Auth. Provider   naproxen (NAPROSYN) 500 MG tablet Take 1 tablet (500 mg total) by mouth 2 (two) times daily. 30 tablet Ameliah Baskins, [place] C, PA-C     PDMP not reviewed this encounter.   Trice Aspinall, [place] C, PA-C 07/14/20 2128    Lew Dawes, PA-C 07/14/20 2128

## 2020-07-14 NOTE — Discharge Instructions (Addendum)
Xray normal  Please use naprosyn twice daily with food consistently over the next 7-10 days Follow up if not improving or worsening, developing increased swelling/discoloration

## 2020-10-22 ENCOUNTER — Telehealth: Payer: Self-pay | Admitting: Internal Medicine

## 2020-10-22 NOTE — Telephone Encounter (Signed)
Please make her an appt.  Will fill her meds until appt and then go from there.  She has not been seen here in almost 2 years.  Let me know when appt scheduled.

## 2020-10-22 NOTE — Telephone Encounter (Signed)
Patient and father Mr. Huberty called stating [facility] [place] does not have a doctor right now and they do not know when they will have one. They were advised by them to call her PCP (Dr. Delrae Alfred) and ask if she is willing to continue to refill her medications until there is another doctor .  Medications are below  Clonidine 0.1 MG tablet - take 0.1 tablet at night  Escitalopram  20 mg tablet -1 tablet daily   Patient has not been seen since 11/2018.

## 2020-10-23 NOTE — Telephone Encounter (Signed)
Pt. Scheduled for 11/24/20 @ 11:30 am.

## 2020-11-13 ENCOUNTER — Telehealth: Payer: Self-pay | Admitting: Internal Medicine

## 2020-11-13 MED ORDER — CLONIDINE HCL 0.1 MG PO TABS: 0.1000 mg | ORAL_TABLET | Freq: Two times a day (BID) | ORAL | 0 refills | Status: DC

## 2020-11-13 MED ORDER — ESCITALOPRAM OXALATE 20 MG PO TABS: 20.0000 mg | ORAL_TABLET | Freq: Every day | ORAL | 0 refills | Status: DC

## 2020-11-23 NOTE — Telephone Encounter (Signed)
Called patient and let her know that her prescriptions were sent.

## 2020-11-24 ENCOUNTER — Encounter: Payer: Self-pay | Admitting: Internal Medicine

## 2020-11-24 ENCOUNTER — Telehealth: Payer: Self-pay | Admitting: Internal Medicine

## 2020-11-24 ENCOUNTER — Ambulatory Visit (INDEPENDENT_AMBULATORY_CARE_PROVIDER_SITE_OTHER): Payer: Medicare Other | Admitting: Internal Medicine

## 2020-11-24 ENCOUNTER — Other Ambulatory Visit: Payer: Self-pay

## 2020-11-24 VITALS — BP 110/68 | HR 72 | Resp 16 | Ht 66.5 in | Wt 133.0 lb

## 2020-11-24 DIAGNOSIS — Z23 Encounter for immunization: Secondary | ICD-10-CM | POA: Diagnosis not present

## 2020-11-24 DIAGNOSIS — F840 Autistic disorder: Secondary | ICD-10-CM

## 2020-11-24 DIAGNOSIS — Z9109 Other allergy status, other than to drugs and biological substances: Secondary | ICD-10-CM | POA: Diagnosis not present

## 2020-11-24 DIAGNOSIS — F32A Depression, unspecified: Secondary | ICD-10-CM

## 2020-11-24 DIAGNOSIS — F411 Generalized anxiety disorder: Secondary | ICD-10-CM

## 2020-11-24 DIAGNOSIS — F909 Attention-deficit hyperactivity disorder, unspecified type: Secondary | ICD-10-CM

## 2020-11-24 MED ORDER — ESCITALOPRAM OXALATE 20 MG PO TABS: 20.0000 mg | ORAL_TABLET | Freq: Every day | ORAL | 11 refills | Status: DC

## 2020-11-24 MED ORDER — CLONIDINE HCL 0.1 MG PO TABS: ORAL_TABLET | ORAL | 11 refills | Status: DC

## 2020-11-24 MED ORDER — OLOPATADINE HCL 0.6 % NA SOLN: NASAL | 11 refills | Status: DC

## 2020-11-24 MED ORDER — AZELASTINE HCL 0.1 % NA SOLN: NASAL | 12 refills | Status: DC

## 2020-11-24 NOTE — Progress Notes (Signed)
    Subjective:    Patient ID: Sylvia [name], female   DOB: 09-18-1996, 24 y.o.   MRN: [id number]   HPI   1.  Autistic Spectrum/ADHD/Social Anxiety and Depression:  Follows with counselor, Leta Speller, [facility] [place].  Previously had meds for mental health prescribed by "Dr. [name],"  Who is no longer at [facility] and needs her meds prescribed here. Takes Clonidine only at night.  Father adds she occasionally needs the half dose in morning.Takes for sleep. Escitalopram is for depression and anxiety She feels her medications work well for her.    2.  Environmental Allergies:  Year round, more prominent in winter and sometimes summer.  Using Patanase and Benadryl.  Controls her symptoms well.  She thinks she is allergic to dust.  Denies history of allergy testing.       Current Meds  Medication Sig  . cloNIDine (CATAPRES) 0.1 MG tablet Take 1 tablet (0.1 mg total) by mouth 2 (two) times daily. Take 1/2 tablet  . diphenhydrAMINE (WAL-DRYL ALLERGY) 25 mg capsule Take 25 mg by mouth daily.  Marland Kitchen escitalopram (LEXAPRO) 20 MG tablet Take 1 tablet (20 mg total) by mouth at bedtime.  . Olopatadine HCl (PATANASE NA) Place 0.665 % into the nose daily. 1-2 sprays each nostril daily for allergies   No Known Allergies   Review of Systems    Objective:   BP 110/68 (BP Location: Left Arm, Patient Position: Sitting, Cuff Size: Normal)   Pulse 72   Resp 16   Ht 5' 6.5" (1.689 m)   Wt 133 lb (60.3 kg)   LMP 11/11/2020 (Approximate)   BMI 21.15 kg/m   Physical Exam  NAD HEENT:  PERRL, EOMI, TMs pearly gray, throat without injection Neck:  Supple No adenopathy, no thyromegaly Chest:  CTA CV:  RRR with normal S1 and S2, No S3, S4 or murmur.  Radial pulses normal and equal LE:  No edema.   Assessment & Plan   1.  Autisticspectrum/ADHD/Depression/Anxiety:  Continue with care at [facility].  Refilled Clonidine and Escitalopram  2.  Environmental allergies:  Refilled  nasal Patanase.  Ultimately returned as not covered.  Sending new Rx for Azelastine nasal spray, which is covered.  3.  HM:  Recommended booster for COVID.  Td today.

## 2020-11-24 NOTE — Telephone Encounter (Signed)
See phone note

## 2020-12-01 NOTE — Telephone Encounter (Signed)
Patient was notified via phone call about the Dr. Phoebe Sharps.

## 2020-12-03 ENCOUNTER — Encounter (HOSPITAL_COMMUNITY): Payer: Self-pay | Admitting: Emergency Medicine

## 2020-12-03 ENCOUNTER — Ambulatory Visit (HOSPITAL_COMMUNITY)
Admission: EM | Admit: 2020-12-03 | Discharge: 2020-12-03 | Disposition: A | Discharge status: Home / Self Care | Payer: Medicare Other | Attending: Family Medicine | Admitting: Family Medicine

## 2020-12-03 ENCOUNTER — Other Ambulatory Visit: Payer: Self-pay

## 2020-12-03 DIAGNOSIS — Z1152 Encounter for screening for COVID-19: Secondary | ICD-10-CM | POA: Diagnosis not present

## 2020-12-03 NOTE — ED Triage Notes (Signed)
Pt presents for COVID test. States lives with father that currently has Covid symptoms.

## 2020-12-03 NOTE — Discharge Instructions (Signed)

## 2020-12-04 LAB — SARS CORONAVIRUS 2 (TAT 6-24 HRS): SARS Coronavirus 2: NEGATIVE

## 2020-12-14 ENCOUNTER — Ambulatory Visit (HOSPITAL_COMMUNITY)
Admission: EM | Admit: 2020-12-14 | Discharge: 2020-12-14 | Disposition: A | Discharge status: Home / Self Care | Payer: Medicare Other | Attending: Family Medicine | Admitting: Family Medicine

## 2020-12-14 ENCOUNTER — Ambulatory Visit (INDEPENDENT_AMBULATORY_CARE_PROVIDER_SITE_OTHER): Payer: Medicare Other

## 2020-12-14 ENCOUNTER — Other Ambulatory Visit: Payer: Self-pay

## 2020-12-14 ENCOUNTER — Encounter (HOSPITAL_COMMUNITY): Payer: Self-pay

## 2020-12-14 DIAGNOSIS — S99922A Unspecified injury of left foot, initial encounter: Secondary | ICD-10-CM

## 2020-12-14 MED ORDER — IBUPROFEN 600 MG PO TABS: 600.0000 mg | ORAL_TABLET | Freq: Three times a day (TID) | ORAL | 0 refills | Status: DC | PRN

## 2020-12-14 NOTE — ED Provider Notes (Signed)
[facility]    CSN: [id number] Arrival date & time: 12/14/20  1648      History   Chief Complaint Chief Complaint  Patient presents with  . Foot Injury    HPI Sylvia [name] is a 24 y.o. female.   Patient is a 24 year old female who presents today with left knee injury.  This occurred today after reports "stepping on it wrong".  Reporting heard a pop in the top of her foot.  Since she is unable to bear weight on it.  Mild generalized swelling to the foot.  No treatments prior to arrival.  No numbness or tingling.     Past Medical History:  Diagnosis Date  . Anxiety    [name] is her counselor at [facility] [place].  [name] prescribes her mental health meds.  . Autism spectrum    High functioning  . Depression    [name] at [facility] of the [facility]    Patient Active Problem List   Diagnosis Date Noted  . Environmental allergies 10/02/2018  . Depression   . Autism spectrum   . Autism spectrum disorder 12/23/2013  . Anxiety state, unspecified 12/23/2013  . Behavior disturbance 12/23/2013  . Outbursts of anger 12/23/2013  . Tremor 12/23/2013    Past Surgical History:  Procedure Laterality Date  . EYE SURGERY  2001   strabismus corrective surgery  . WISDOM TOOTH EXTRACTION  2012    OB History   No obstetric history on file.      Home Medications    Prior to Admission medications   Medication Sig Start Date End Date Taking? Authorizing Provider  azelastine (ASTELIN) 0.1 % nasal spray 1-2 sprays each nostril twice daily as needed 11/24/20   Julieanne Manson, MD  cloNIDine (CATAPRES) 0.1 MG tablet 1/2 tab by mouth in morning and 1 tab by mouth at bedtime daily 11/24/20   Julieanne Manson, MD  diphenhydrAMINE (WAL-DRYL ALLERGY) 25 mg capsule Take 25 mg by mouth daily.    [provider]  escitalopram (LEXAPRO) 20 MG tablet Take 1 tablet (20 mg total) by mouth at bedtime. 11/24/20   Julieanne Manson, MD  ibuprofen  (ADVIL) 600 MG tablet Take 1 tablet (600 mg total) by mouth every 8 (eight) hours as needed for moderate pain. 12/14/20   Janace Aris, NP    Family History Family History  Problem Relation Age of Onset  . Hyperlipidemia Father   . Diabetes Father        prediabetes  . Anxiety disorder Sister   . Asthma Brother   . Heart attack Maternal Grandmother   . Lung cancer Paternal Grandmother     Social History Social History   Tobacco Use  . Smoking status: Never Smoker  . Smokeless tobacco: Never Used  Vaping Use  . Vaping Use: Never used  Substance Use Topics  . Alcohol use: Yes    Comment: Rare-special occasions  . Drug use: No     Allergies   Patient has no known allergies.   Review of Systems Review of Systems   Physical Exam Triage Vital Signs ED Triage Vitals  Enc Vitals Group     BP 12/14/20 1725 113/75     Pulse Rate 12/14/20 1725 88     Resp 12/14/20 1725 20     Temp 12/14/20 1725 97.9 F (36.6 C)     Temp Source 12/14/20 1725 Oral     SpO2 12/14/20 1725 98 %  Weight --      Height --      Head Circumference --      Peak Flow --      Pain Score 12/14/20 1724 5     Pain Loc --      Pain Edu? --      Excl. in GC? --    No data found.  Updated Vital Signs BP 113/75 (BP Location: Right Arm)   Pulse 88   Temp 97.9 F (36.6 C) (Oral)   Resp 20   LMP 12/14/2020   SpO2 98%   Visual Acuity Right Eye Distance:   Left Eye Distance:   Bilateral Distance:    Right Eye Near:   Left Eye Near:    Bilateral Near:     Physical Exam Vitals and nursing note reviewed.  Constitutional:      General: She is not in acute distress.    Appearance: Normal appearance. She is not ill-appearing, toxic-appearing or diaphoretic.  HENT:     Head: Normocephalic.  Eyes:     Conjunctiva/sclera: Conjunctivae normal.  Pulmonary:     Effort: Pulmonary effort is normal.  Musculoskeletal:        General: Normal range of motion.     Cervical back: Normal range  of motion.       Feet:  Feet:     Comments: Tender to palpation with generalized swelling to the area.  2+ pedal pulse.  Sensation intact.  Limited flexion of the foot Skin:    General: Skin is warm and dry.     Findings: No rash.  Neurological:     Mental Status: She is alert.  Psychiatric:        Mood and Affect: Mood normal.      UC Treatments / Results  Labs (all labs ordered are listed, but only abnormal results are displayed) Labs Reviewed - No data to display  EKG   Radiology DG Foot Complete Left  Result Date: 12/14/2020 CLINICAL DATA:  Foot pain status post injury. EXAM: LEFT FOOT - COMPLETE 3+ VIEW COMPARISON:  July 02, 2014 FINDINGS: There is a tiny fragment adjacent to the dorsal navicular. There is some surrounding soft tissue swelling. There is no other acute osseous abnormality detected. There are no significant degenerative changes. IMPRESSION: Tiny fragment adjacent to the dorsal navicular suspicious for a small avulsion fracture. Electronically Signed   By: Katherine Mantle M.D.   On: 12/14/2020 17:55    Procedures Procedures (including critical care time)  Medications Ordered in UC Medications - No data to display  Initial Impression / Assessment and Plan / UC Course  I have reviewed the triage vital signs and the nursing notes.  Pertinent labs & imaging results that were available during my care of the patient were reviewed by me and considered in my medical decision making (see chart for details).     Left foot injury.  There is a tiny fragment adjacent to the dorsal navicular suspicious for small avulsion fracture on x-ray. We will go ahead and place in cam walker boot and have her be nonweightbearing. Contact given for orthopedics for follow-up.  Recommended rest, ice, elevate and ibuprofen for pain as needed. Follow up as needed for continued or worsening symptoms  Final Clinical Impressions(s) / UC Diagnoses   Final diagnoses:  Injury of  left foot, initial encounter     Discharge Instructions     Concern for possible small fracture and possible ligament injury.  We  are placing you in a boot. Crutches to be non weight bearing.  Ibuprofen for pain as needed.  Please call orthopedics for follow up.     ED Prescriptions    Medication Sig Dispense Auth. Provider   ibuprofen (ADVIL) 600 MG tablet Take 1 tablet (600 mg total) by mouth every 8 (eight) hours as needed for moderate pain. 30 tablet Dahlia Byes A, NP     PDMP not reviewed this encounter.   Janace Aris, NP 12/14/20 1828

## 2020-12-14 NOTE — Discharge Instructions (Signed)
Concern for possible small fracture and possible ligament injury.  We are placing you in a boot. Crutches to be non weight bearing.  Ibuprofen for pain as needed.  Please call orthopedics for follow up.

## 2020-12-14 NOTE — ED Triage Notes (Signed)
Pt presents with left foot injury after stepping on it the wrong; pt states she heard a pop in her foot, she states she cannot bear weight on it.

## 2021-01-05 ENCOUNTER — Ambulatory Visit (HOSPITAL_COMMUNITY)
Admission: EM | Admit: 2021-01-05 | Discharge: 2021-01-05 | Disposition: A | Discharge status: Home / Self Care | Payer: Medicare Other | Attending: Student | Admitting: Student

## 2021-01-05 ENCOUNTER — Encounter (HOSPITAL_COMMUNITY): Payer: Self-pay

## 2021-01-05 ENCOUNTER — Other Ambulatory Visit: Payer: Self-pay

## 2021-01-05 DIAGNOSIS — J3089 Other allergic rhinitis: Secondary | ICD-10-CM | POA: Diagnosis present

## 2021-01-05 DIAGNOSIS — Z20822 Contact with and (suspected) exposure to covid-19: Secondary | ICD-10-CM | POA: Diagnosis not present

## 2021-01-05 MED ORDER — PROMETHAZINE-DM 6.25-15 MG/5ML PO SYRP: 5.0000 mL | ORAL_SOLUTION | Freq: Four times a day (QID) | ORAL | 0 refills | Status: DC | PRN

## 2021-01-05 MED ORDER — DM-GUAIFENESIN ER 30-600 MG PO TB12: 1.0000 | ORAL_TABLET | Freq: Two times a day (BID) | ORAL | 1 refills | Status: DC

## 2021-01-05 MED ORDER — CETIRIZINE HCL 10 MG PO TABS: 10.0000 mg | ORAL_TABLET | Freq: Every day | ORAL | 1 refills | Status: DC

## 2021-01-05 NOTE — ED Triage Notes (Signed)
Pt c/o right ear pain, HA, congestion, and runny nose for approx 1 week.  Denies fever, sore throat, n/v/d. Has been taking tylenol-last dose taken Monday and Tuesday.

## 2021-01-05 NOTE — Discharge Instructions (Addendum)
-  Continue Flonase -Mucinex DM during the day -Promethazine DM cough syrup for congestion/cough. This could make you drowsy, so take at night before bed. -Zyrtec once daily for allergies  -follow-up with ENT if symptoms worsen/persist. Information provided below.

## 2021-01-05 NOTE — ED Provider Notes (Signed)
[facility]    CSN: [id number] Arrival date & time: 01/05/21  1245      History   Chief Complaint Chief Complaint  Patient presents with  . Otalgia    HPI Sylvia [name] is a 25 y.o. female Pt c/o right ear pain, HA, congestion, and runny nose for approx 1 week. History anxiety, autism, depression. Endorses R ear pressure and fullness, relieved by blowing nose. Endorses seasonal allergies, for which she is only taking flonase at this time. Feeling well otherwise- Denies fever, sore throat, n/v/d, cough. Has been taking tylenol-last dose taken Monday and Tuesday- with some improvement in pain. Denies dizziness, tinnitus. No history recurrent AOM.   HPI  Past Medical History:  Diagnosis Date  . Anxiety    [name] is her counselor at [facility] [place].  [name] prescribes her mental health meds.  . Autism spectrum    High functioning  . Depression    [name] at [facility] of the [facility]    Patient Active Problem List   Diagnosis Date Noted  . Environmental allergies 10/02/2018  . Depression   . Autism spectrum   . Autism spectrum disorder 12/23/2013  . Anxiety state, unspecified 12/23/2013  . Behavior disturbance 12/23/2013  . Outbursts of anger 12/23/2013  . Tremor 12/23/2013    Past Surgical History:  Procedure Laterality Date  . EYE SURGERY  2001   strabismus corrective surgery  . WISDOM TOOTH EXTRACTION  2012    OB History   No obstetric history on file.      Home Medications    Prior to Admission medications   Medication Sig Start Date End Date Taking? Authorizing Provider  cetirizine (ZYRTEC ALLERGY) 10 MG tablet Take 1 tablet (10 mg total) by mouth daily. 01/05/21  Yes Rhys Martini, PA-C  cloNIDine (CATAPRES) 0.1 MG tablet 1/2 tab by mouth in morning and 1 tab by mouth at bedtime daily 11/24/20  Yes Julieanne Manson, MD  dextromethorphan-guaiFENesin [facility] DM) 30-600 MG 12hr tablet Take 1 tablet by mouth 2 (two) times  daily. 01/05/21  Yes Rhys Martini, PA-C  diphenhydrAMINE (BENADRYL) 25 mg capsule Take 25 mg by mouth daily.   Yes [provider]  escitalopram (LEXAPRO) 20 MG tablet Take 1 tablet (20 mg total) by mouth at bedtime. 11/24/20  Yes Julieanne Manson, MD  fluticasone (FLONASE) 50 MCG/ACT nasal spray Place into both nostrils daily.   Yes [provider]  promethazine-dextromethorphan (PROMETHAZINE-DM) 6.25-15 MG/5ML syrup Take 5 mLs by mouth 4 (four) times daily as needed for cough. 01/05/21  Yes Rhys Martini, PA-C  azelastine (ASTELIN) 0.1 % nasal spray 1-2 sprays each nostril twice daily as needed 11/24/20   Julieanne Manson, MD  ibuprofen (ADVIL) 600 MG tablet Take 1 tablet (600 mg total) by mouth every 8 (eight) hours as needed for moderate pain. 12/14/20   Janace Aris, NP    Family History Family History  Problem Relation Age of Onset  . Hyperlipidemia Father   . Diabetes Father        prediabetes  . Anxiety disorder Sister   . Asthma Brother   . Heart attack Maternal Grandmother   . Lung cancer Paternal Grandmother     Social History Social History   Tobacco Use  . Smoking status: Never Smoker  . Smokeless tobacco: Never Used  Vaping Use  . Vaping Use: Never used  Substance Use Topics  . Alcohol use: Yes    Comment: Rare-special occasions  .  Drug use: No     Allergies   Patient has no known allergies.   Review of Systems Review of Systems  Constitutional: Negative for appetite change, chills and fever.  HENT: Positive for ear pain. Negative for congestion, rhinorrhea, sinus pressure, sinus pain and sore throat.   Eyes: Negative for redness and visual disturbance.  Respiratory: Negative for cough, chest tightness, shortness of breath and wheezing.   Cardiovascular: Negative for chest pain and palpitations.  Gastrointestinal: Negative for abdominal pain, constipation, diarrhea, nausea and vomiting.  Genitourinary: Negative for dysuria,  frequency and urgency.  Musculoskeletal: Negative for myalgias.  Neurological: Negative for dizziness, weakness and headaches.  Psychiatric/Behavioral: Negative for confusion.  All other systems reviewed and are negative.    Physical Exam Triage Vital Signs ED Triage Vitals  Enc Vitals Group     BP 01/05/21 1424 119/81     Pulse Rate 01/05/21 1424 98     Resp 01/05/21 1424 16     Temp 01/05/21 1424 98.8 F (37.1 C)     Temp Source 01/05/21 1424 Oral     SpO2 01/05/21 1424 99 %     Weight --      Height --      Head Circumference --      Peak Flow --      Pain Score 01/05/21 1418 5     Pain Loc --      Pain Edu? --      Excl. in GC? --    No data found.  Updated Vital Signs BP 119/81 (BP Location: Right Arm)   Pulse 98   Temp 98.8 F (37.1 C) (Oral)   Resp 16   LMP 12/14/2020   SpO2 99%   Visual Acuity Right Eye Distance:   Left Eye Distance:   Bilateral Distance:    Right Eye Near:   Left Eye Near:    Bilateral Near:     Physical Exam Vitals reviewed.  Constitutional:      General: She is not in acute distress.    Appearance: Normal appearance. She is not ill-appearing.  HENT:     Head: Normocephalic and atraumatic.     Right Ear: Hearing, ear canal and external ear normal. No swelling or tenderness. A middle ear effusion is present. There is no impacted cerumen. No mastoid tenderness. Tympanic membrane is not perforated, erythematous, retracted or bulging.     Left Ear: Hearing, tympanic membrane, ear canal and external ear normal. No swelling or tenderness.  No middle ear effusion. There is no impacted cerumen. No mastoid tenderness. Tympanic membrane is not perforated, erythematous, retracted or bulging.     Nose: Nose normal.     Right Sinus: No maxillary sinus tenderness or frontal sinus tenderness.     Left Sinus: No maxillary sinus tenderness or frontal sinus tenderness.     Mouth/Throat:     Mouth: Mucous membranes are moist.     Pharynx: Uvula  midline. No oropharyngeal exudate or posterior oropharyngeal erythema.     Tonsils: No tonsillar exudate.  Eyes:     Extraocular Movements: Extraocular movements intact.     Conjunctiva/sclera: Conjunctivae normal.     Pupils: Pupils are equal, round, and reactive to light.  Cardiovascular:     Rate and Rhythm: Normal rate and regular rhythm.     Heart sounds: Normal heart sounds.  Pulmonary:     Breath sounds: Normal breath sounds and air entry. No wheezing, rhonchi or rales.  Chest:  Chest wall: No tenderness.  Abdominal:     General: Abdomen is flat. Bowel sounds are normal.     Tenderness: There is no abdominal tenderness. There is no guarding or rebound.  Lymphadenopathy:     Cervical: No cervical adenopathy.  Neurological:     General: No focal deficit present.     Mental Status: She is alert and oriented to person, place, and time.  Psychiatric:        Attention and Perception: Attention and perception normal.        Mood and Affect: Mood and affect normal.        Behavior: Behavior normal. Behavior is cooperative.        Thought Content: Thought content normal.        Judgment: Judgment normal.      UC Treatments / Results  Labs (all labs ordered are listed, but only abnormal results are displayed) Labs Reviewed  SARS CORONAVIRUS 2 (TAT 6-24 HRS)    EKG   Radiology No results found.  Procedures Procedures (including critical care time)  Medications Ordered in UC Medications - No data to display  Initial Impression / Assessment and Plan / UC Course  I have reviewed the triage vital signs and the nursing notes.  Pertinent labs & imaging results that were available during my care of the patient were reviewed by me and considered in my medical decision making (see chart for details).     Exam consistent with allergic rhinitis with R ear effusion. Rec flonase, mucinex, promethazine DM, zyrtec. F/u with [place] or ENT if symptoms worsen/persist. They verbalize  understanding and agreement.   Final Clinical Impressions(s) / UC Diagnoses   Final diagnoses:  Non-seasonal allergic rhinitis due to other allergic trigger     Discharge Instructions     -Continue Flonase -Mucinex DM during the day -Promethazine DM cough syrup for congestion/cough. This could make you drowsy, so take at night before bed. -Zyrtec once daily for allergies  -follow-up with ENT if symptoms worsen/persist. Information provided below.    ED Prescriptions    Medication Sig Dispense Auth. Provider   cetirizine (ZYRTEC ALLERGY) 10 MG tablet Take 1 tablet (10 mg total) by mouth daily. 30 tablet Rhys Martini, PA-C   dextromethorphan-guaiFENesin [facility] DM) 30-600 MG 12hr tablet Take 1 tablet by mouth 2 (two) times daily. 30 tablet Rhys Martini, PA-C   promethazine-dextromethorphan (PROMETHAZINE-DM) 6.25-15 MG/5ML syrup Take 5 mLs by mouth 4 (four) times daily as needed for cough. 118 mL Rhys Martini, PA-C     PDMP not reviewed this encounter.   Rhys Martini, PA-C 01/05/21 [phone]

## 2021-01-06 LAB — SARS CORONAVIRUS 2 (TAT 6-24 HRS): SARS Coronavirus 2: NEGATIVE

## 2021-02-26 ENCOUNTER — Other Ambulatory Visit: Payer: Self-pay

## 2021-02-26 ENCOUNTER — Encounter: Payer: Self-pay | Admitting: Internal Medicine

## 2021-02-26 ENCOUNTER — Ambulatory Visit (INDEPENDENT_AMBULATORY_CARE_PROVIDER_SITE_OTHER): Payer: Medicare Other | Admitting: Internal Medicine

## 2021-02-26 VITALS — BP 118/82 | HR 96 | Resp 20 | Ht 61.5 in | Wt 136.5 lb

## 2021-02-26 DIAGNOSIS — Z9109 Other allergy status, other than to drugs and biological substances: Secondary | ICD-10-CM | POA: Diagnosis not present

## 2021-02-26 DIAGNOSIS — F840 Autistic disorder: Secondary | ICD-10-CM | POA: Diagnosis not present

## 2021-02-26 DIAGNOSIS — K6289 Other specified diseases of anus and rectum: Secondary | ICD-10-CM | POA: Diagnosis not present

## 2021-02-26 DIAGNOSIS — Z0000 Encounter for general adult medical examination without abnormal findings: Secondary | ICD-10-CM | POA: Diagnosis not present

## 2021-02-26 NOTE — Progress Notes (Signed)
Subjective:    Patient ID: Sylvia [name], female   DOB: Jun 08, 1996, 25 y.o.   MRN: [id number]   HPI   CPE with pap  1.  Pap:  Last and only performed 12/11/2018, so not due for about 1 year.  Normal  2.  Mammogram:  Never.  No family history of breast cancer.    3.  Osteoprevention:  Drinks a large glass 2% milk every night.  Not much in way of physical activity.    4.  Guaiac Cards:  Never.    5.  Colonoscopy:  Never.  No family history of colon cancer.    6.  Immunizations:  Has received COVID booster Pfizer in February she believes.    Immunization History  Administered Date(s) Administered   Influenza Inj Mdck Quad Pf 12/11/2018   Influenza-Unspecified 10/10/2019, 08/17/2020   PFIZER(Purple Top)SARS-COV-2 Vaccination 03/27/2020, 04/17/2020   Td 11/24/2020   Tdap 01/28/2008     7.  Glucose/Cholesterol:  Has not had bloodwork drawn in some time.  She has eaten this morning.  Current Meds  Medication Sig   azelastine (ASTELIN) 0.1 % nasal spray 1-2 sprays each nostril twice daily as needed   cloNIDine (CATAPRES) 0.1 MG tablet Take 0.1 mg by mouth daily. 1 tab by mouth at bedtime. Does not take in morning as prescribed   EPINEPHrine 0.3 mg/0.3 mL IJ SOAJ injection See admin instructions.   escitalopram (LEXAPRO) 20 MG tablet Take 1 tablet (20 mg total) by mouth at bedtime.   fluticasone (FLONASE) 50 MCG/ACT nasal spray Place into both nostrils daily.   levocetirizine (XYZAL) 5 MG tablet Take 5 mg by mouth every evening.   mometasone (ELOCON) 0.1 % cream 1 application   No Known Allergies  Past Medical History:  Diagnosis Date   ADHD    Anxiety    Mechele Claude is her counselor at [facility] [place].  Vaughan Basta prescribes her mental health meds.   Autism spectrum    High functioning   Depression    [name] at [facility] of the [place]   Past Surgical History:  Procedure Laterality Date   EYE SURGERY  2001   strabismus corrective surgery   WISDOM TOOTH  EXTRACTION  2012   Family History  Problem Relation Age of Onset   Hypertension Mother    Other Mother        Prediabetes   Hyperlipidemia Father    Other Father        Prediabetes   Anxiety disorder Sister    Asthma Brother    Heart attack Maternal Grandmother    Lung cancer Paternal Grandmother    Social History   Socioeconomic History   Marital status: Single    Spouse name: Not on file   Number of children: 0   Years of education: 12   Highest education level: High school graduate  Occupational History   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     Would like to live on her own.     Knows she  has led a sheltered life and not clear she can live independently with paying bills, etc.     Social Determinants of Health   Financial Resource Strain: Low Risk  (02/26/2021)   Overall Financial Resource Strain (CARDIA)    Difficulty of Paying Living Expenses: Not hard at all  Food Insecurity: No Food Insecurity (02/26/2021)   Hunger Vital Sign    Worried About Running Out of Food in the Last Year: Never true    [place] in the Last Year: Never true  Transportation Needs: No Transportation Needs (02/26/2021)   PRAPARE - Hydrologist (Medical): No    Lack of Transportation (Non-Medical): No  Physical Activity: Not on file  Stress: No Stress Concern Present (10/02/2018)   [place]    Feeling of Stress : Not at all  Social Connections: Not on file  Intimate Partner Violence: Unknown (10/02/2018)    Humiliation, Afraid, Rape, and Kick questionnaire    Fear of Current or Ex-Partner: Not on file    Emotionally Abused: No    Physically Abused: No    Sexually Abused: Not on file      Review of Systems  Constitutional: Negative for appetite change and fatigue.  HENT: Positive for postnasal drip, sinus pressure and sneezing. Negative for dental problem, ear pain, hearing loss and sore throat.   Eyes: Negative for visual disturbance (Good correction with glasses).  Respiratory: Negative for cough and shortness of breath.   Cardiovascular: Negative for chest pain, palpitations and leg swelling.  Gastrointestinal: Negative for abdominal pain, blood in stool (No melena), constipation and diarrhea.  Genitourinary: Negative for dysuria, menstrual problem and vaginal discharge.  Musculoskeletal: Negative for arthralgias and joint swelling.  Skin: Positive for rash (Eczema being treated by Allergies/Asthma .  Dr. Donneta Romberg).  Neurological: Negative for weakness and numbness.  Psychiatric/Behavioral: Positive for dysphoric mood (Dr. Lilia Argue). The patient is nervous/anxious (Dr. Valetta Fuller).       Objective:   BP 118/82 (BP Location: Left Arm, Patient Position: Sitting, Cuff Size: Normal)   Pulse 96   Resp 20   Ht 5' 1.5" (1.562 m)   Wt 136 lb 8 oz (61.9 kg)   LMP 02/08/2021 (Approximate) Comment: Last 5-6 days  BMI 25.37 kg/m   Physical Exam HENT:     Head: Normocephalic and atraumatic.     Right Ear: Tympanic membrane, ear canal and external ear normal.     Left Ear: Tympanic membrane, ear canal and external ear normal.     Nose: Congestion (constant snorting with intake of air through nose/throat) present.     Mouth/Throat:     Mouth: Mucous membranes are moist.     Comments: Mild cobbling of posterior pharynx. Eyes:     Extraocular Movements: Extraocular movements intact.     Conjunctiva/sclera: Conjunctivae normal.     Pupils: Pupils are equal, round, and reactive to  light.     Comments: Discs sharp  Neck:     Thyroid: No thyroid mass or thyromegaly.  Cardiovascular:     Rate and Rhythm: Normal rate and regular rhythm.     Pulses: Normal pulses.     Heart sounds: S1 normal and S2 normal. No murmur heard.    No friction rub. No S3 or S4 sounds.  Pulmonary:     Effort: Pulmonary effort is normal.     Breath sounds: Normal breath sounds and air entry.  Chest:  Breasts:    Right: No inverted nipple, mass or nipple discharge.     Left: No inverted nipple, mass or nipple discharge.  Abdominal:     General: Abdomen is flat. Bowel sounds are normal.     Palpations: Abdomen is soft. There is no hepatomegaly, splenomegaly or mass.     Tenderness: There is no abdominal tenderness.     Hernia: No hernia is present.  Genitourinary:    General: Normal vulva.     Comments: Mild superficial fissuring from anus anteriorly onto perineum--admits to streaks of blood on tissue not with stool.  No itching--after harsher soap change  No vag discharge.  Musculoskeletal:        General: Normal range of motion.     Cervical back: Normal range of motion and neck supple.     Right lower leg: No edema.     Left lower leg: No edema.     Comments: Exaggerated lordosis of lumbar back.  Lymphadenopathy:     Head:     Right side of head: No submental or submandibular adenopathy.     Left side of head: No submental or submandibular adenopathy.     Cervical: No cervical adenopathy.     Upper Body:     Right upper body: No supraclavicular or axillary adenopathy.     Left upper body: No supraclavicular or axillary adenopathy.     Lower Body: No right inguinal adenopathy. No left inguinal adenopathy.  Skin:    Comments: Dry skin of hands  Neurological:     Mental Status: She is alert.     Cranial Nerves: Cranial nerves 2-12 are intact.     Sensory: Sensation is intact.     Motor: Motor function is intact.     Coordination: Coordination is intact.     Gait: Gait is  intact.     Deep Tendon Reflexes: Reflexes are normal and symmetric.  Psychiatric:        Mood and Affect: Affect is blunt.        Behavior: Behavior is cooperative.     Comments: Poor eye contact. Does not pick up on social cues.      Assessment & Plan   CPE without pap Encouraged Influenza vaccine in fall Follow up in 1-2 weeks for fasting labs.  2.  Anal/perineal irritation:  Avoid harsh soap or any soap.  Avoid aggressive cleaning of area.  Pat clean with Tucks and allow to dry.  Cool water aveeno sitz baths.  Apply vaseline to area after cleaning.    3.  Allergies:  Encouraged use of allergy meds regularly.  4.  Autistic spectrum/ADHD/depression/anxiety:  as per psych.

## 2021-03-22 ENCOUNTER — Other Ambulatory Visit: Payer: Medicare Other | Admitting: Internal Medicine

## 2021-03-22 ENCOUNTER — Other Ambulatory Visit: Payer: Self-pay

## 2021-03-22 DIAGNOSIS — Z13220 Encounter for screening for lipoid disorders: Secondary | ICD-10-CM

## 2021-03-22 DIAGNOSIS — Z0000 Encounter for general adult medical examination without abnormal findings: Secondary | ICD-10-CM

## 2021-03-22 DIAGNOSIS — Z114 Encounter for screening for human immunodeficiency virus [HIV]: Secondary | ICD-10-CM

## 2021-03-22 DIAGNOSIS — Z79899 Other long term (current) drug therapy: Secondary | ICD-10-CM

## 2021-03-22 DIAGNOSIS — Z1159 Encounter for screening for other viral diseases: Secondary | ICD-10-CM

## 2021-03-23 LAB — LIPID PANEL W/O CHOL/HDL RATIO
Cholesterol, Total: 210 mg/dL — ABNORMAL HIGH (ref 100–199)
HDL: 59 mg/dL (ref >39)
LDL Chol Calc (NIH): 140 mg/dL — ABNORMAL HIGH (ref 0–99)
Triglycerides: 59 mg/dL (ref 0–149)
VLDL Cholesterol Cal: 11 mg/dL (ref 5–40)

## 2021-03-23 LAB — CBC WITH DIFFERENTIAL/PLATELET
Basophils Absolute: 0 10*3/uL (ref 0.0–0.2)
Basos: 1 %
EOS (ABSOLUTE): 0.1 10*3/uL (ref 0.0–0.4)
Eos: 3 %
Hematocrit: 37.3 % (ref 34.0–46.6)
Hemoglobin: 12.7 g/dL (ref 11.1–15.9)
Immature Grans (Abs): 0 10*3/uL (ref 0.0–0.1)
Immature Granulocytes: 0 %
Lymphocytes Absolute: 1.6 10*3/uL (ref 0.7–3.1)
Lymphs: 44 %
MCH: 32.1 pg (ref 26.6–33.0)
MCHC: 34 g/dL (ref 31.5–35.7)
MCV: 94 fL (ref 79–97)
Monocytes Absolute: 0.3 10*3/uL (ref 0.1–0.9)
Monocytes: 9 %
Neutrophils Absolute: 1.6 10*3/uL (ref 1.4–7.0)
Neutrophils: 43 %
Platelets: 249 10*3/uL (ref 150–450)
RBC: 3.96 x10E6/uL (ref 3.77–5.28)
RDW: 13.2 % (ref 11.7–15.4)
WBC: 3.6 10*3/uL (ref 3.4–10.8)

## 2021-03-23 LAB — COMPREHENSIVE METABOLIC PANEL
ALT: 8 IU/L (ref 0–32)
AST: 13 IU/L (ref 0–40)
Albumin/Globulin Ratio: 1.8 (ref 1.2–2.2)
Albumin: 4.8 g/dL (ref 3.9–5.0)
Alkaline Phosphatase: 46 IU/L (ref 44–121)
BUN/Creatinine Ratio: 25 — ABNORMAL HIGH (ref 9–23)
BUN: 18 mg/dL (ref 6–20)
Bilirubin Total: 0.4 mg/dL (ref 0.0–1.2)
CO2: 23 mmol/L (ref 20–29)
Calcium: 9.1 mg/dL (ref 8.7–10.2)
Chloride: 102 mmol/L (ref 96–106)
Creatinine, Ser: 0.71 mg/dL (ref 0.57–1.00)
Globulin, Total: 2.7 g/dL (ref 1.5–4.5)
Glucose: 82 mg/dL (ref 65–99)
Potassium: 4.1 mmol/L (ref 3.5–5.2)
Sodium: 138 mmol/L (ref 134–144)
Total Protein: 7.5 g/dL (ref 6.0–8.5)
eGFR: 121 mL/min/{1.73_m2} (ref >59)

## 2021-03-23 LAB — HEPATITIS C ANTIBODY: Hep C Virus Ab: 0.5 s/co ratio (ref 0.0–0.9)

## 2021-03-23 LAB — HIV ANTIBODY (ROUTINE TESTING W REFLEX): HIV Screen 4th Generation wRfx: NONREACTIVE

## 2021-12-09 ENCOUNTER — Other Ambulatory Visit: Payer: Self-pay | Admitting: Internal Medicine

## 2021-12-15 IMAGING — DX DG FOOT COMPLETE 3+V*R*
3 series · 3 of 3 positions shown · non-contrast
Comparison: None.

CLINICAL DATA: Two-month history of pain at the MTP joint of the
great toe.

EXAM:
RIGHT FOOT COMPLETE - 3+ VIEW

[foot ap]
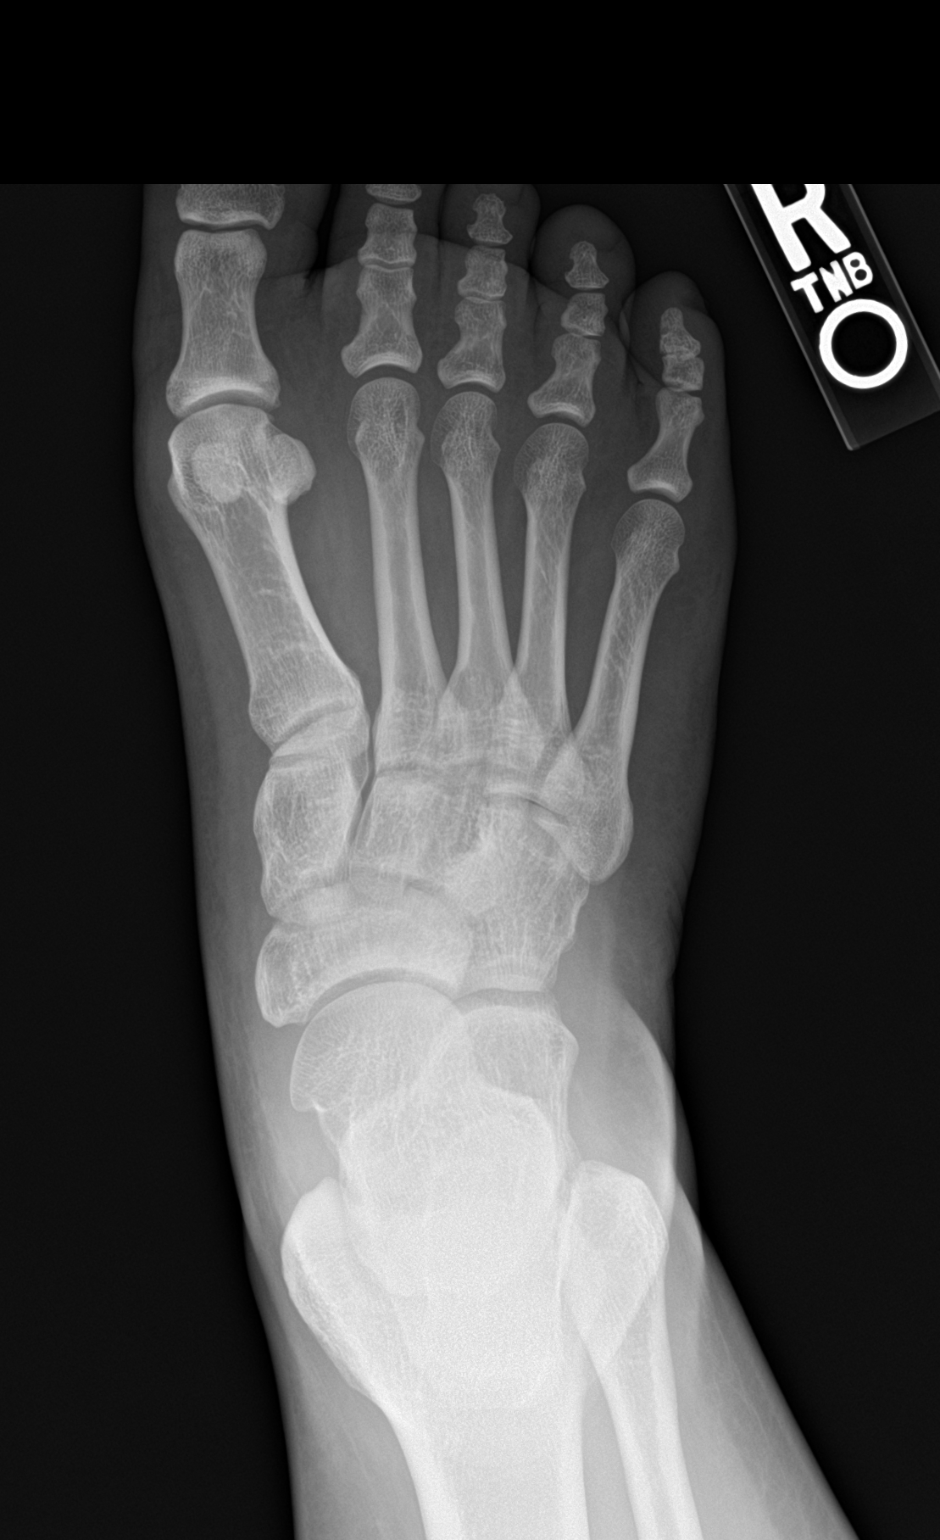

[foot obl]
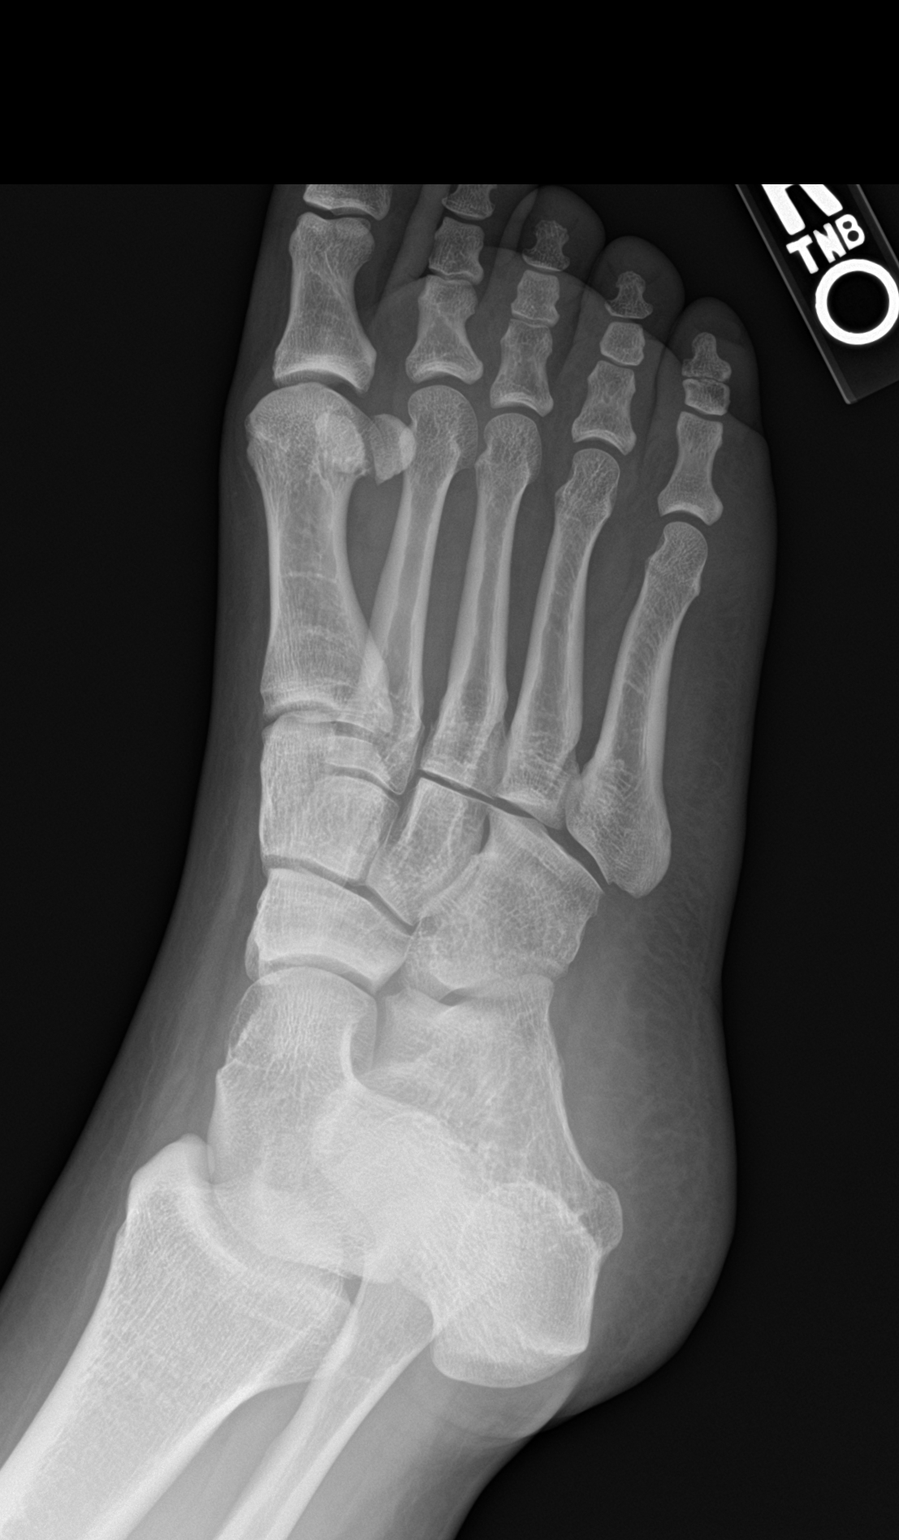

[foot lat]
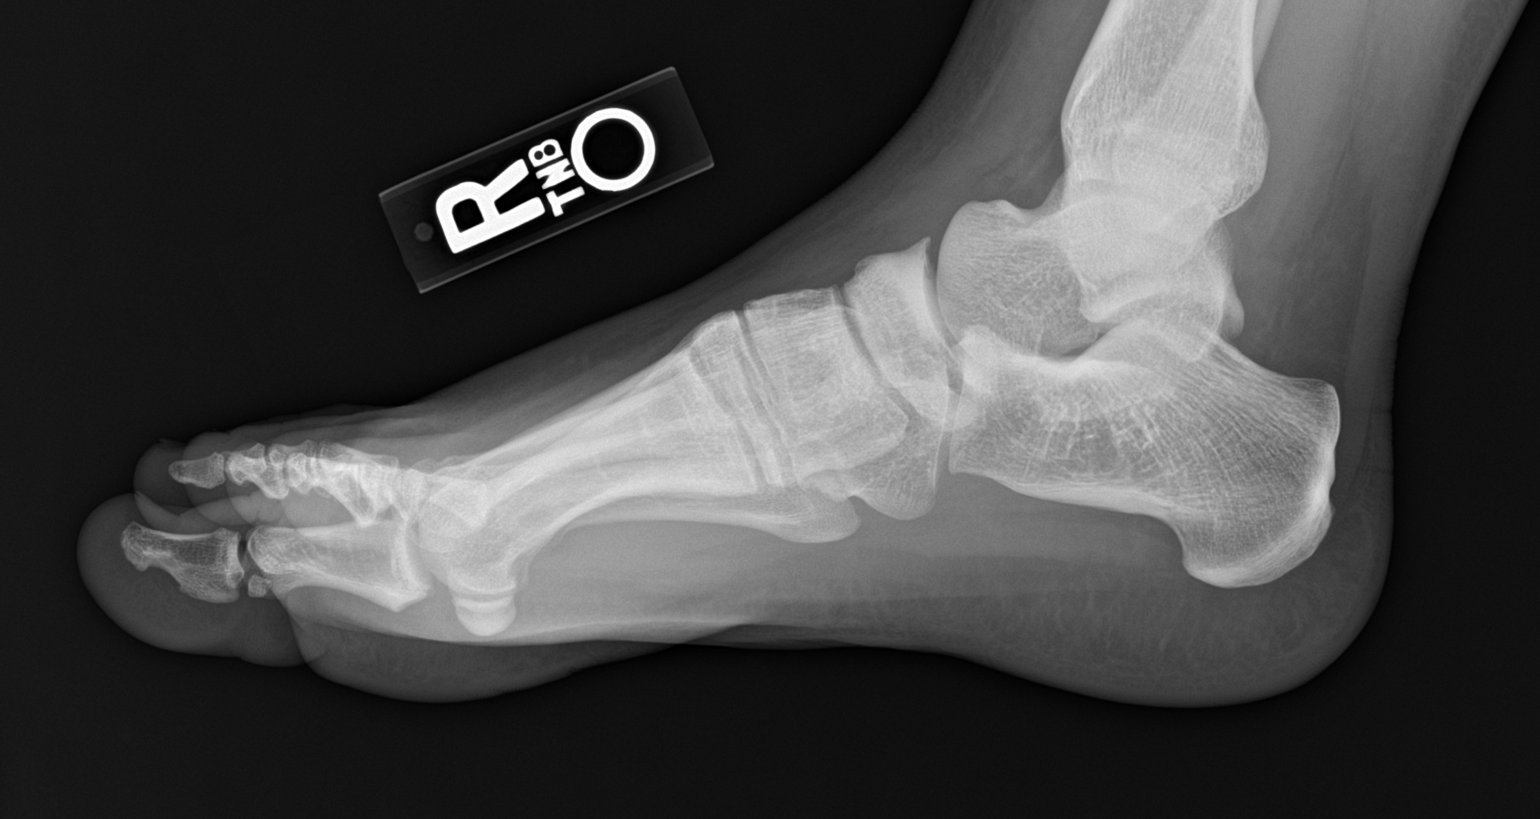

[3 of 3 positions shown; findings below may reference images not displayed]

FINDINGS: There is no evidence of fracture or dislocation. There is no
evidence of arthropathy or other focal bone abnormality. Soft
tissues are unremarkable.
IMPRESSION: Negative.

## 2021-12-17 ENCOUNTER — Other Ambulatory Visit: Payer: Self-pay | Admitting: Internal Medicine

## 2022-03-04 ENCOUNTER — Ambulatory Visit: Payer: Medicare Other | Admitting: Internal Medicine

## 2022-03-04 ENCOUNTER — Encounter: Payer: Self-pay | Admitting: Internal Medicine

## 2022-03-04 ENCOUNTER — Other Ambulatory Visit: Payer: Self-pay

## 2022-03-04 VITALS — BP 110/64 | HR 79 | Resp 12 | Ht 61.0 in | Wt 133.5 lb

## 2022-03-04 DIAGNOSIS — Z0000 Encounter for general adult medical examination without abnormal findings: Secondary | ICD-10-CM

## 2022-03-04 DIAGNOSIS — Z124 Encounter for screening for malignant neoplasm of cervix: Secondary | ICD-10-CM

## 2022-03-04 DIAGNOSIS — F909 Attention-deficit hyperactivity disorder, unspecified type: Secondary | ICD-10-CM | POA: Insufficient documentation

## 2022-03-04 MED ORDER — FLUTICASONE PROPIONATE 50 MCG/ACT NA SUSP: NASAL | 11 refills | Status: AC

## 2022-03-04 MED ORDER — MOMETASONE FUROATE 0.1 % EX CREA: TOPICAL_CREAM | CUTANEOUS | 2 refills | Status: AC

## 2022-03-04 MED ORDER — ESCITALOPRAM OXALATE 20 MG PO TABS: ORAL_TABLET | ORAL | 11 refills | Status: DC

## 2022-03-04 MED ORDER — CLONIDINE HCL 0.1 MG PO TABS: ORAL_TABLET | ORAL | 11 refills | Status: DC

## 2022-03-04 MED ORDER — LEVOCETIRIZINE DIHYDROCHLORIDE 5 MG PO TABS: 5.0000 mg | ORAL_TABLET | Freq: Every evening | ORAL | 11 refills | Status: AC

## 2022-03-04 MED ORDER — EPINEPHRINE 0.3 MG/0.3ML IJ SOAJ: 0.3000 mg | INTRAMUSCULAR | 1 refills | Status: AC | PRN

## 2022-03-04 MED ORDER — AZELASTINE HCL 0.1 % NA SOLN: NASAL | 11 refills | Status: DC

## 2022-03-04 NOTE — Progress Notes (Unsigned)
? ? ?  Subjective:  ?  ?Patient ID: Sylvia [name], female   DOB: 03-08-1996, 26 y.o.   MRN: [id number] ? ? ?HPI ? ?CPE with pap ? ?1.  Pap:  Last pap 11/2018 and normal.   ? ?2.  Mammogram:  Never.  Maternal aunt with history of breast cancer, postmenopausal around age 89 years.  ? ?3.  Osteoprevention:  Drinks 1 cup 2% milk daily.  Willing to increase to 3-4 cups daily.  Not very physically active.  She was working at a Psychiatric nurse.  She does not go outside much.  Willing to think about doing that.   ? ?4.  Guaiac Cards/FIT:  Never.   ? ?5.  Colonoscopy:  Never.  No family history of colon cancer.   ? ?6.  Immunizations:  Has not had bivalent COVID vaccine yet.   ? ?7.  Glucose/Cholesterol :  Blood glucose in past fine.  Cholesterol high last week.   ?Lipid Panel  ?   ?Component Value Date/Time  ? CHOL 210 (H) 03/22/2021 0905  ? TRIG 59 03/22/2021 0905  ? HDL 59 03/22/2021 0905  ? LDLCALC 140 (H) 03/22/2021 0905  ? LABVLDL 11 03/22/2021 0905  ? ? ? ?8.  Allergies:  is getting weekly allergy shots for multiple environmental allergies at [name].   ? ?Current Meds  ?Medication Sig  ?? azelastine (ASTELIN) 0.1 % nasal spray 1-2 sprays each nostril twice daily as needed  ?? cloNIDine (CATAPRES) 0.1 MG tablet 1 tab by mouth at bedtime nightly  ?? EPINEPHrine 0.3 mg/0.3 mL IJ SOAJ injection See admin instructions.  ?? escitalopram (LEXAPRO) 20 MG tablet TAKE 1 TABLET(20 MG) BY MOUTH AT BEDTIME  ?? fluticasone (FLONASE) 50 MCG/ACT nasal spray Place into both nostrils daily.  ?? ibuprofen (ADVIL) 600 MG tablet Take 1 tablet (600 mg total) by mouth every 8 (eight) hours as needed for moderate pain.  ?? levocetirizine (XYZAL) 5 MG tablet Take 5 mg by mouth every evening.  ?? mometasone (ELOCON) 0.1 % cream 1 application  ? ? ?No Known Allergies ? ? ?Review of Systems  ?HENT:  Positive for rhinorrhea. Negative for dental problem (Dr. Linward Headland  every 6 months.).   ?Eyes:  Negative for visual disturbance (Eyeglasses work  well for her.  Dr. Maple Hudson.).  ?Respiratory:  Negative for cough and shortness of breath.   ?Cardiovascular:  Negative for chest pain, palpitations and leg swelling.  ?Gastrointestinal:  Negative for abdominal pain, blood in stool (No melena.), constipation and diarrhea.  ?Genitourinary:   ?     Periods regular and without problem.  ?Musculoskeletal:  Negative for arthralgias.  ?Skin:  Negative for rash.  ?Neurological:  Negative for weakness and numbness.  ?Psychiatric/Behavioral:  Positive for dysphoric mood (Needs a new counselor.). The patient is nervous/anxious.   ? ? ? ?Objective:  ? ?BP 110/64 (BP Location: Right Arm, Patient Position: Sitting, Cuff Size: Normal)   Pulse 79   Resp 12   Ht 5\' 1"  (1.549 m)   Wt 133 lb 8 oz (60.6 kg)   LMP 02/14/2022 (Exact Date)   BMI 25.22 kg/m?  ? ?Physical Exam ? ? ?Assessment & Plan  ? ? CPE with pap ? ?2.  Needs new psychologist that takes medicaid ?

## 2022-03-22 ENCOUNTER — Other Ambulatory Visit: Payer: Medicare Other

## 2022-03-31 ENCOUNTER — Other Ambulatory Visit: Payer: Medicare Other

## 2022-05-17 IMAGING — DX DG FOOT COMPLETE 3+V*L*
3 series · 3 of 3 positions shown · non-contrast
Comparison: July 02, 2014

CLINICAL DATA: Foot pain status post injury.

EXAM:
LEFT FOOT - COMPLETE 3+ VIEW

[foot ap]
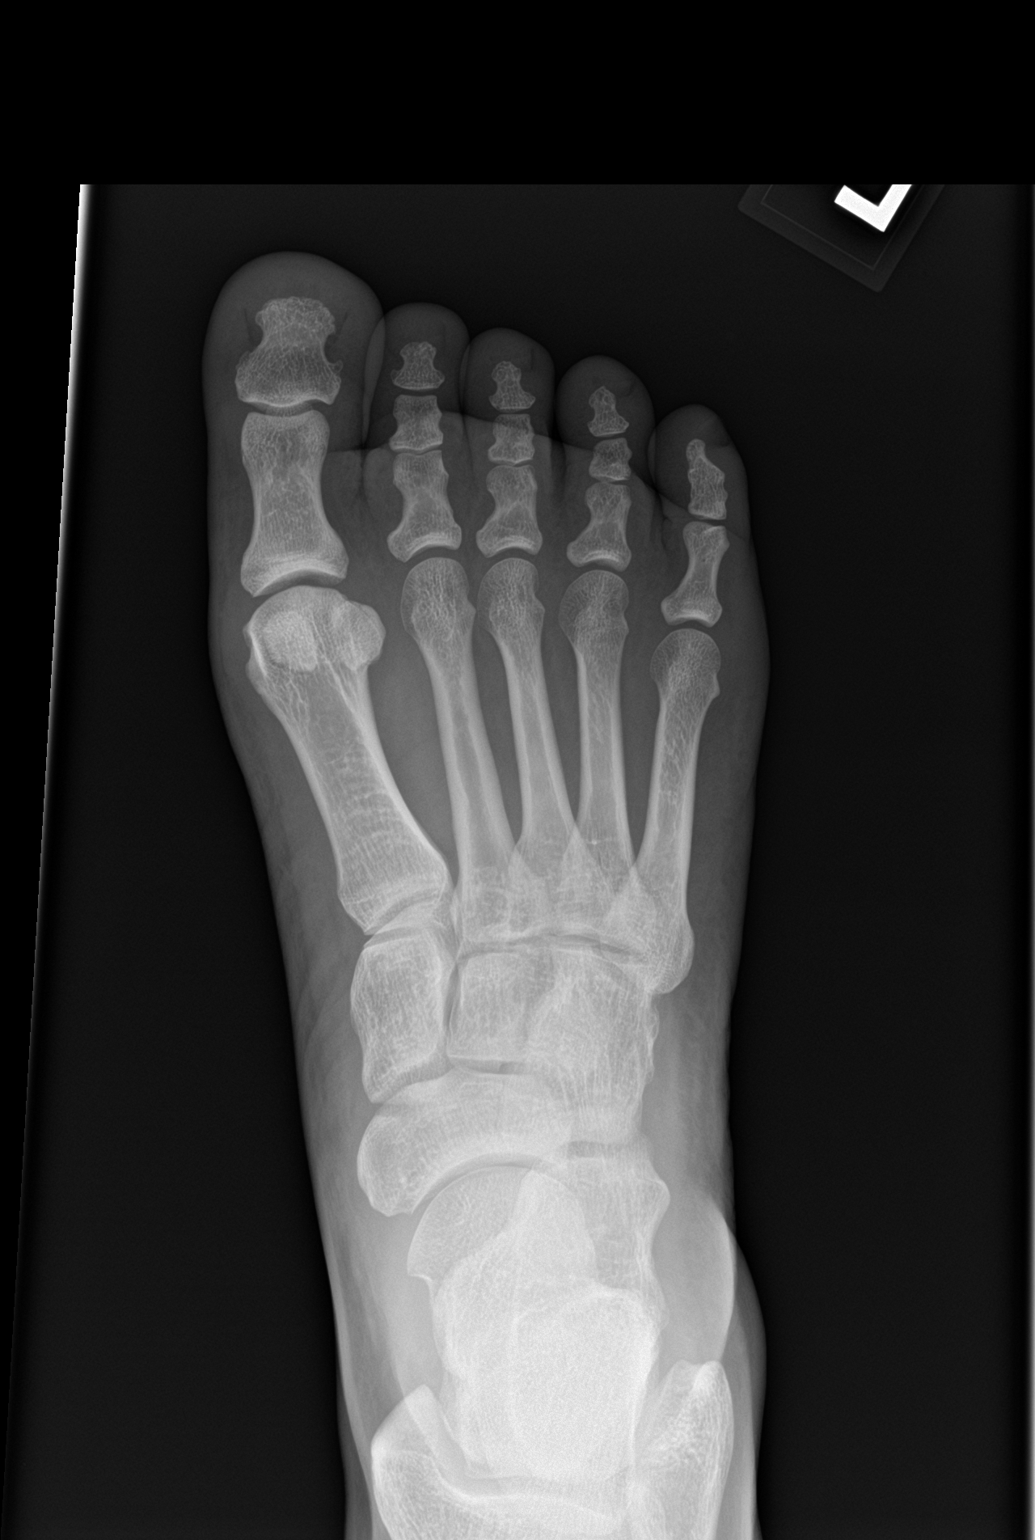

[foot obl]
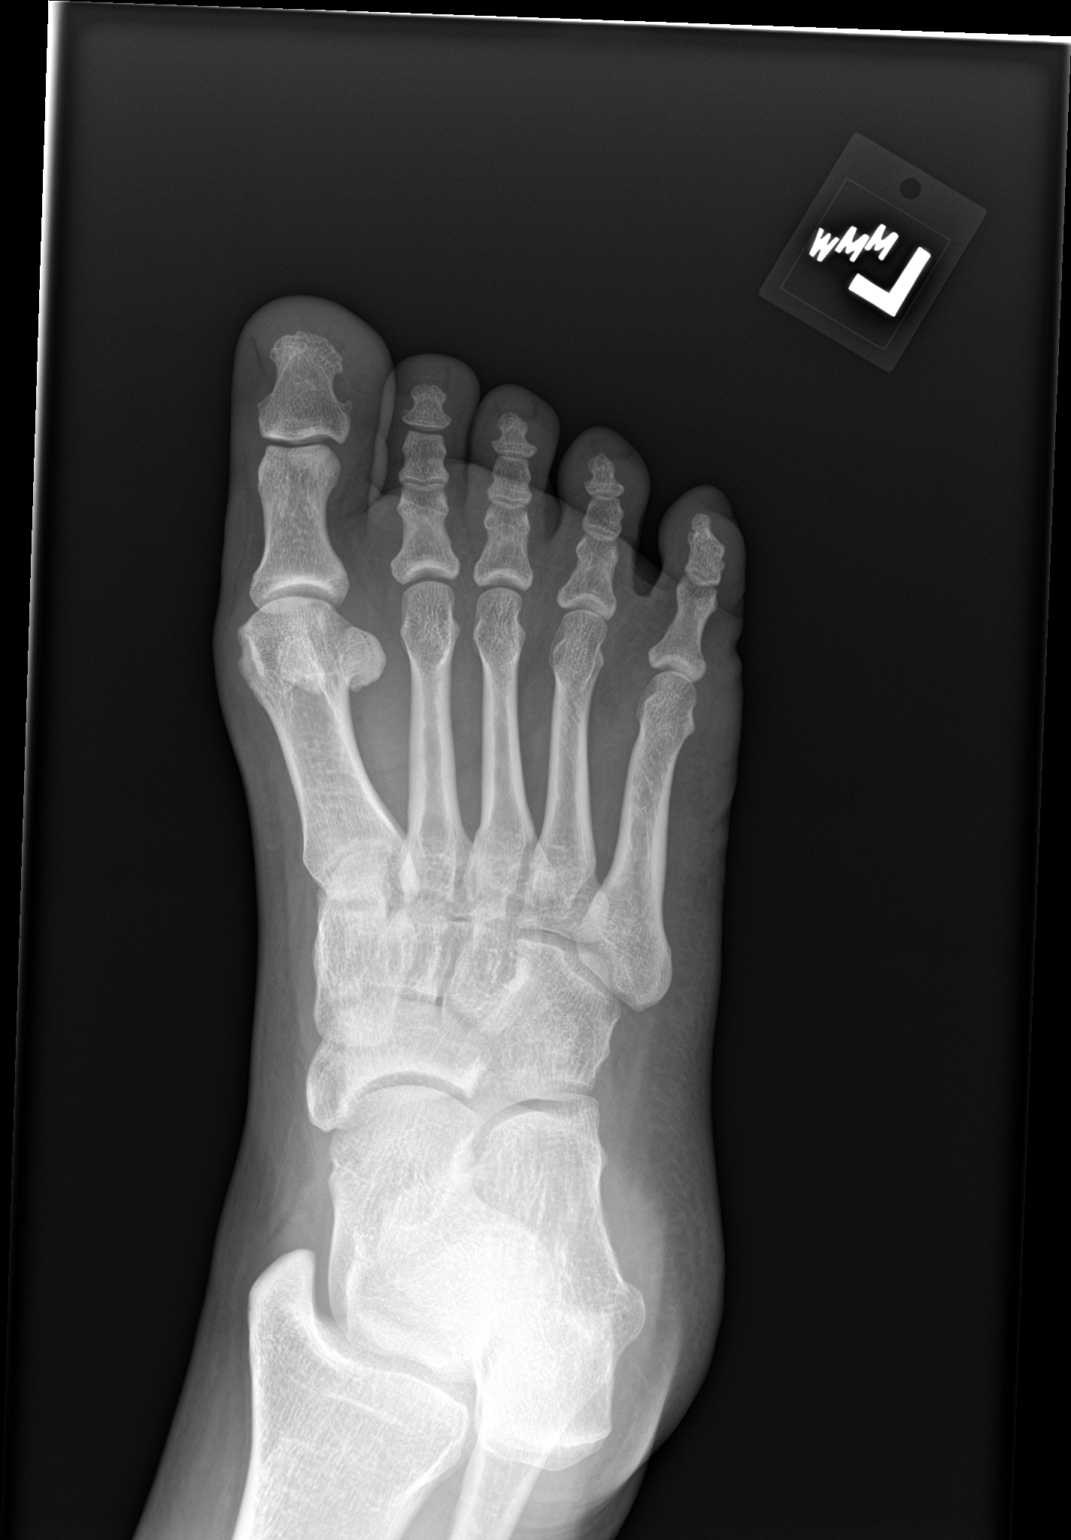

[foot lat]
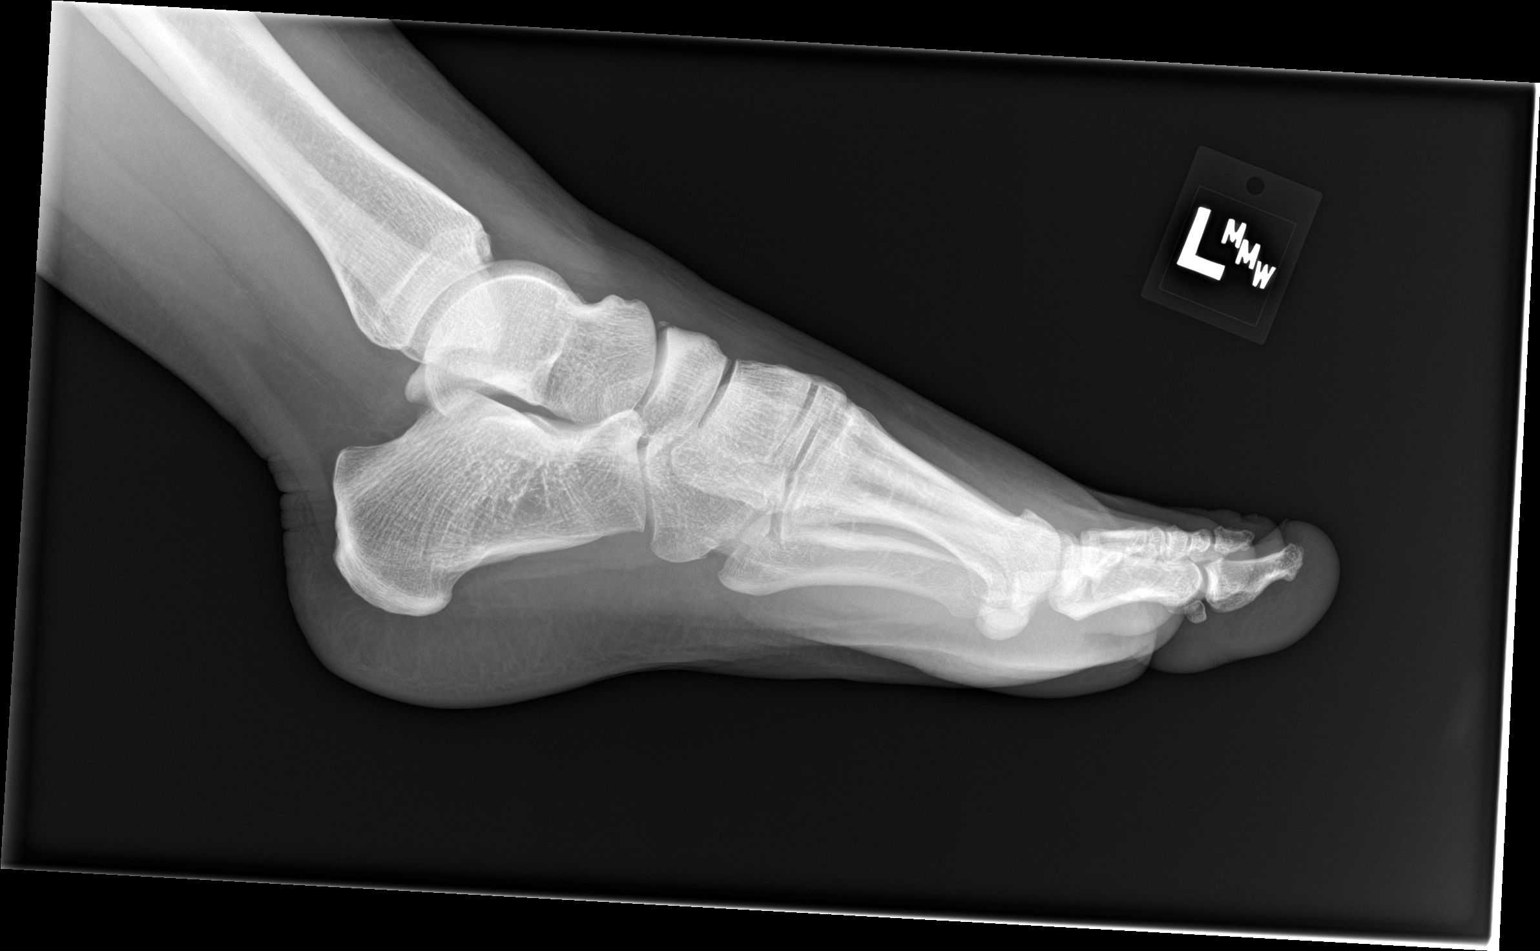

[3 of 3 positions shown; findings below may reference images not displayed]

FINDINGS: There is a tiny fragment adjacent to the dorsal navicular. There is
some surrounding soft tissue swelling. There is no other acute
osseous abnormality detected. There are no significant degenerative
changes.
IMPRESSION: Tiny fragment adjacent to the dorsal navicular suspicious for a
small avulsion fracture.

## 2022-08-07 ENCOUNTER — Ambulatory Visit (HOSPITAL_COMMUNITY)
Admission: EM | Admit: 2022-08-07 | Discharge: 2022-08-07 | Disposition: A | Discharge status: Home / Self Care | Payer: Medicare Other | Attending: Family Medicine | Admitting: Family Medicine

## 2022-08-07 DIAGNOSIS — M545 Low back pain, unspecified: Secondary | ICD-10-CM | POA: Diagnosis not present

## 2022-08-07 MED ORDER — KETOROLAC TROMETHAMINE 30 MG/ML IJ SOLN
30.0000 mg | Freq: Once | INTRAMUSCULAR | Status: AC
  Administered 2022-08-07: 30 mg via INTRAMUSCULAR

## 2022-08-07 MED ORDER — KETOROLAC TROMETHAMINE 30 MG/ML IJ SOLN
INTRAMUSCULAR | Status: AC
  Filled 2022-08-07: qty 1

## 2022-08-07 MED ORDER — IBUPROFEN 800 MG PO TABS: 800.0000 mg | ORAL_TABLET | Freq: Three times a day (TID) | ORAL | 0 refills | Status: DC | PRN

## 2022-08-07 MED ORDER — TIZANIDINE HCL 4 MG PO TABS: 4.0000 mg | ORAL_TABLET | Freq: Three times a day (TID) | ORAL | 0 refills | Status: DC | PRN

## 2022-08-07 NOTE — ED Provider Notes (Signed)
[facility]    CSN: [id number] Arrival date & time: 08/07/22  1604      History   Chief Complaint Chief Complaint  Patient presents with   Back Pain    HPI LORALI KHAMIS is a 26 y.o. female.    Back Pain  Here for bilateral low back pain in the last few days.  No trauma or fall.  She has been taking care of her dad who is in a wheelchair and she feels all the pushing and pulling has probably caused some of this.  Last menstrual cycle was about 1 week ago.  She is confident that she is not pregnant as she has not been exposed. No fever or chills or dysuria or hematuria or urinary frequency.  No rash.  She has tried 1000 mg of Tylenol and it did help just a little  No bowel or bladder incontinence and no muscle weakness.  Past Medical History:  Diagnosis Date   ADHD    Anxiety    [name] is her counselor at [facility] [place].  [name] prescribes her mental health meds.   Autism spectrum    High functioning   Depression    [name] at [facility] of the [facility]    Patient Active Problem List   Diagnosis Date Noted   ADHD 03/04/2022   Environmental allergies 10/02/2018   Depression    Autism spectrum    Autism spectrum disorder 12/23/2013   Anxiety state, unspecified 12/23/2013   Behavior disturbance 12/23/2013   Outbursts of anger 12/23/2013   Tremor 12/23/2013    Past Surgical History:  Procedure Laterality Date   EYE SURGERY  2001   strabismus corrective surgery   WISDOM TOOTH EXTRACTION  2012    OB History   No obstetric history on file.      Home Medications    Prior to Admission medications   Medication Sig Start Date End Date Taking? Authorizing Provider  ibuprofen (ADVIL) 800 MG tablet Take 1 tablet (800 mg total) by mouth every 8 (eight) hours as needed (pain). 08/07/22  Yes Alexxis Mackert, Janace Aris, MD  tiZANidine (ZANAFLEX) 4 MG tablet Take 1 tablet (4 mg total) by mouth every 8 (eight) hours as needed for muscle spasms.  08/07/22  Yes Zenia Resides, MD  azelastine (ASTELIN) 0.1 % nasal spray 1-2 sprays each nostril twice daily as needed 03/04/22   Julieanne Manson, MD  cloNIDine (CATAPRES) 0.1 MG tablet 1 tab by mouth at bedtime nightly 03/04/22   Julieanne Manson, MD  EPINEPHrine 0.3 mg/0.3 mL IJ SOAJ injection Inject 0.3 mg into the muscle as needed for anaphylaxis. 03/04/22   Julieanne Manson, MD  escitalopram (LEXAPRO) 20 MG tablet TAKE 1 TABLET(20 MG) BY MOUTH AT BEDTIME 03/04/22   Julieanne Manson, MD  fluticasone [facility]) 50 MCG/ACT nasal spray 2 sprays each nostril once daily. 03/04/22   Julieanne Manson, MD  levocetirizine (XYZAL) 5 MG tablet Take 1 tablet (5 mg total) by mouth every evening. 03/04/22   Julieanne Manson, MD  mometasone (ELOCON) 0.1 % cream 1 application to affected areas twice daily as needed. 03/04/22   Julieanne Manson, MD    Family History Family History  Problem Relation Age of Onset   Hypertension Mother    Other Mother        Prediabetes   Hyperlipidemia Father    Other Father        Prediabetes   Anxiety disorder Sister    Asthma Brother  Heart attack Maternal Grandmother    Lung cancer Paternal Grandmother     Social History Social History   Tobacco Use   Smoking status: Never   Smokeless tobacco: Never  Vaping Use   Vaping Use: Never used  Substance Use Topics   Alcohol use: Yes    Comment: Rare-special occasions   Drug use: No     Allergies   Patient has no known allergies.   Review of Systems Review of Systems  Musculoskeletal:  Positive for back pain.     Physical Exam Triage Vital Signs ED Triage Vitals  Enc Vitals Group     BP 08/07/22 1737 118/71     Pulse Rate 08/07/22 1737 74     Resp 08/07/22 1737 18     Temp 08/07/22 1737 98 F (36.7 C)     Temp src --      SpO2 08/07/22 1737 98 %     Weight --      Height --      Head Circumference --      Peak Flow --      Pain Score 08/07/22 1736 6     Pain Loc --       Pain Edu? --      Excl. in GC? --    No data found.  Updated Vital Signs BP 118/71   Pulse 74   Temp 98 F (36.7 C)   Resp 18   LMP 08/01/2022   SpO2 98%   Visual Acuity Right Eye Distance:   Left Eye Distance:   Bilateral Distance:    Right Eye Near:   Left Eye Near:    Bilateral Near:     Physical Exam Vitals reviewed.  Constitutional:      General: She is not in acute distress.    Appearance: She is not ill-appearing, toxic-appearing or diaphoretic.  HENT:     Mouth/Throat:     Mouth: Mucous membranes are moist.  Eyes:     Pupils: Pupils are equal, round, and reactive to light.  Cardiovascular:     Rate and Rhythm: Normal rate and regular rhythm.     Heart sounds: No murmur heard. Pulmonary:     Effort: Pulmonary effort is normal.     Breath sounds: Normal breath sounds.  Musculoskeletal:        General: No tenderness.     Cervical back: Neck supple.  Lymphadenopathy:     Cervical: No cervical adenopathy.  Skin:    Coloration: Skin is not jaundiced or pale.     Findings: No rash.  Neurological:     General: No focal deficit present.     Mental Status: She is alert and oriented to person, place, and time.  Psychiatric:        Behavior: Behavior normal.      UC Treatments / Results  Labs (all labs ordered are listed, but only abnormal results are displayed) Labs Reviewed  POCT URINALYSIS DIPSTICK, ED / UC  POC URINE PREG, ED    EKG   Radiology No results found.  Procedures Procedures (including critical care time)  Medications Ordered in UC Medications  ketorolac (TORADOL) 30 MG/ML injection 30 mg (has no administration in time range)    Initial Impression / Assessment and Plan / UC Course  I have reviewed the triage vital signs and the nursing notes.  Pertinent labs & imaging results that were available during my care of the patient were reviewed by me and  considered in my medical decision making (see chart for details).      We will treat with Toradol and ibuprofen and tizanidine.  She does have a PCP and will follow up with them Final Clinical Impressions(s) / UC Diagnoses   Final diagnoses:  Acute bilateral low back pain without sciatica     Discharge Instructions      You have been given a shot of Toradol 30 mg today.  Take ibuprofen 800 mg--1 tab every 8 hours as needed for pain.   Take tizanidine 4 mg--1 every 8 hours as needed for muscle spasms  Your PCP tomorrow for follow-up.     ED Prescriptions     Medication Sig Dispense Auth. Provider   ibuprofen (ADVIL) 800 MG tablet Take 1 tablet (800 mg total) by mouth every 8 (eight) hours as needed (pain). 21 tablet Sonyia Muro, Janace Aris, MD   tiZANidine (ZANAFLEX) 4 MG tablet Take 1 tablet (4 mg total) by mouth every 8 (eight) hours as needed for muscle spasms. 30 tablet Wade Sigala, Janace Aris, MD      PDMP not reviewed this encounter.   Zenia Resides, MD 08/07/22 [phone]

## 2022-08-07 NOTE — Discharge Instructions (Addendum)
You have been given a shot of Toradol 30 mg today.  Take ibuprofen 800 mg--1 tab every 8 hours as needed for pain.   Take tizanidine 4 mg--1 every 8 hours as needed for muscle spasms  Your PCP tomorrow for follow-up.

## 2022-08-07 NOTE — ED Triage Notes (Signed)
Pt reports to uc with co of low back pain and hip pain since Friday. Pt reports 8/10 pain and has been taking motrin and tylenol for symptoms. Pt reports she has a disabled father and he she may have injured herself helping him.

## 2022-08-16 ENCOUNTER — Encounter: Payer: Self-pay | Admitting: Internal Medicine

## 2022-08-16 ENCOUNTER — Ambulatory Visit: Payer: Medicare Other | Admitting: Internal Medicine

## 2022-08-16 VITALS — BP 110/70 | HR 78 | Resp 12 | Ht 61.0 in | Wt 135.0 lb

## 2022-08-16 DIAGNOSIS — M545 Low back pain, unspecified: Secondary | ICD-10-CM

## 2022-08-16 NOTE — Progress Notes (Unsigned)
    Subjective:    Patient ID: Sylvia [name], female   DOB: [birth year], 26 y.o.   MRN: [id number]   HPI  Here for follow up of back pain for which she was seen in ED on 08/07/2022.  Shows me her Lumbar spine and bilateral posterior pelvic rim area were sites of pain.  She was given Toradol and Tizanidine with good relief and has been taking Ibuprofen 800 mg only as needed.  Has not needed it for at least 2 days.   Never actually filled the Tizanidine.   States the pain seemed to come on suddenly, but was at rest.  Did not occur when she was actually helping her father, who is now wheelchair bound, transfer or move in some way. No urinary frequency or dysuria.   LMP first week of August.   Not sexually active and denies any possibility of being pregnant.    Current Meds  Medication Sig   azelastine (ASTELIN) 0.1 % nasal spray 1-2 sprays each nostril twice daily as needed   cloNIDine (CATAPRES) 0.1 MG tablet 1 tab by mouth at bedtime nightly   EPINEPHrine 0.3 mg/0.3 mL IJ SOAJ injection Inject 0.3 mg into the muscle as needed for anaphylaxis.   escitalopram (LEXAPRO) 20 MG tablet TAKE 1 TABLET(20 MG) BY MOUTH AT BEDTIME   fluticasone (FLONASE) 50 MCG/ACT nasal spray 2 sprays each nostril once daily.   ibuprofen (ADVIL) 800 MG tablet Take 1 tablet (800 mg total) by mouth every 8 (eight) hours as needed (pain).   levocetirizine (XYZAL) 5 MG tablet Take 1 tablet (5 mg total) by mouth every evening.   mometasone (ELOCON) 0.1 % cream 1 application to affected areas twice daily as needed.   No Known Allergies   Review of Systems    Objective:   BP 110/70 (BP Location: Left Arm, Patient Position: Sitting, Cuff Size: Normal)   Pulse 78   Resp 12   Ht 5\' 1"  (1.549 m)   Wt 135 lb (61.2 kg)   LMP 08/01/2022 (Exact Date)   BMI 25.51 kg/m   Physical Exam Normal exam Does have accentuated LS curvature.    Assessment & Plan   ***

## 2022-09-05 ENCOUNTER — Encounter: Payer: Self-pay | Admitting: Physical Therapy

## 2022-09-05 ENCOUNTER — Ambulatory Visit: Payer: Medicare Other | Attending: Internal Medicine | Admitting: Physical Therapy

## 2022-09-05 DIAGNOSIS — M5459 Other low back pain: Secondary | ICD-10-CM | POA: Diagnosis present

## 2022-09-05 DIAGNOSIS — M545 Low back pain, unspecified: Secondary | ICD-10-CM | POA: Diagnosis not present

## 2022-09-05 NOTE — Therapy (Signed)
OUTPATIENT PHYSICAL THERAPY LOWER EXTREMITY EVALUATION ONE TIME VISIT    Patient Name: Sylvia [name] MRN: [id number] DOB:1996/01/19, 26 y.o., female Today's Date: 09/05/2022   PT End of Session - 09/05/22 0943     Visit Number 1    Number of Visits 1    Authorization Type MCR, MCD    PT Start Time 0933    PT Stop Time 1015    PT Time Calculation (min) 42 min    Activity Tolerance Patient tolerated treatment well    Behavior During Therapy Flat affect;Impulsive             Past Medical History:  Diagnosis Date   ADHD    Anxiety    Mechele Claude is her counselor at [facility] [place].  Vaughan Basta prescribes her mental health meds.   Autism spectrum    High functioning   Depression    [name] at [place]   Past Surgical History:  Procedure Laterality Date   EYE SURGERY  2001   strabismus corrective surgery   WISDOM TOOTH EXTRACTION  2012   Patient Active Problem List   Diagnosis Date Noted   ADHD 03/04/2022   Environmental allergies 10/02/2018   Depression    Autism spectrum    Autism spectrum disorder 12/23/2013   Anxiety state, unspecified 12/23/2013   Behavior disturbance 12/23/2013   Outbursts of anger 12/23/2013   Tremor 12/23/2013    PCP: Mack Hook, MD   REFERRING PROVIDER: Mack Hook, MD   REFERRING DIAG: M54.50 (ICD-10-CM) - Acute bilateral low back pain without sciatica   THERAPY DIAG:  Other low back pain  Rationale for Evaluation and Treatment Rehabilitation  ONSET DATE: about 4 weeks  SUBJECTIVE:   SUBJECTIVE STATEMENT: Patient here with her mom who does most of the intake.   She currently has no back pain.  Her pain has been better since going to Urgent Care and getting an injection.  Her mother states the doctor wanted her to get info on posture and body mechanics. At the time she was unable to stand up, walk. She is not sure how it happened but does report lifting and assisting her dad with  mobility.  Her dad is wheelchair bound and does sliding board transfers, has to lift his leg at times and his equipment.  She denies weakness or radicular pain.    PERTINENT HISTORY:  Autism, ADHD, allergies  PAIN:  Are you having pain? No  PRECAUTIONS: None  WEIGHT BEARING RESTRICTIONS No  FALLS:  Has patient fallen in last 6 months? No  LIVING ENVIRONMENT: Lives with: lives with their family Lives in: House/apartment Stairs: yes, no issues  Has following equipment at home: None  OCCUPATION: Not working   PLOF: Independent, Leisure: drawing, and likes to stretch.  She has to help her dad with mobility and lifting his equipment  PATIENT GOALS I don't know.    OBJECTIVE:   DIAGNOSTIC FINDINGS:  none   PATIENT SURVEYS:  none  COGNITION:  Overall cognitive status:  decreased eye contact, seems indifferent to therapy process      SENSATION: WFL   MUSCLE LENGTH: WNL  POSTURE: increased lumbar lordosis  PALPATION: No pain  Stiffness in thoracic spine and relative hypermobility in lumbar spine.   LOWER EXTREMITY ROM:    WNL with hypermobility in hip ER/IR bilateral    Active ROM Right eval Left eval  Hip flexion    Hip extension    Hip abduction  Hip adduction    Hip internal rotation    Hip external rotation    Knee flexion    Knee extension    Ankle dorsiflexion    Ankle plantarflexion    Ankle inversion    Ankle eversion     (Blank rows = not tested)  LOWER EXTREMITY MMT:    5/5 throughout LEs except for hip abd 4/5    MMT Right eval Left eval  Hip flexion     Hip extension    Hip abduction    Hip adduction    Hip internal rotation    Hip external rotation    Knee flexion    Knee extension    Ankle dorsiflexion    Ankle plantarflexion    Ankle inversion    Ankle eversion     (Blank rows = not tested)  LOWER EXTREMITY SPECIAL TESTS:  NT  FUNCTIONAL TESTS:  Normal , full squat to the floor Single leg stance WNL    GAIT: Distance walked: 150 + Assistive device utilized: None Level of assistance: Complete Independence Comments: WNL    TODAY'S TREATMENT: PT Eval Self care, education regarding: hypermobility, core HEP, body mechanics    PATIENT EDUCATION:  Education details: joint hypermobility and HEP Person educated: Patient and Parent Education method: Explanation, Demonstration, and Handouts Education comprehension: verbalized understanding and returned demonstration   HOME EXERCISE PROGRAM: Access Code: [email or web address] URL: [email or web address] Date: 09/05/2022 Prepared by: Raeford Razor  Exercises - Supine Bridge  - 1 x daily - 7 x weekly - 2 sets - 10 reps - 10 hold - Supine Transversus Abdominis Bracing - Hands on Stomach  - 1 x daily - 7 x weekly - 2 sets - 10 reps - 10 hold - Supine 90/90 Abdominal Bracing  - 1 x daily - 7 x weekly - 1 sets - 10 reps - 15 hold - Supine 90/90 Alternating Heel Touches with Posterior Pelvic Tilt  - 1 x daily - 7 x weekly - 2 sets - 10 reps - 5 hold  ASSESSMENT:  CLINICAL IMPRESSION: Patient is a 26 y.o. female who was seen today for physical therapy evaluation and treatment for low back pain which has resolved completely over the past 2-3 weeks. She was given HEP for core, info on posture and body mechanics. She will not come back for PT as she is back to normal, baseline activity.  Participation today was limited by her psychosocial issues and lack of interest in the therapeutic process.  She may have a mild degree of joint hypermobility that may contribute to her initial injury.    OBJECTIVE IMPAIRMENTS decreased strength.   ACTIVITY LIMITATIONS caring for others  PARTICIPATION LIMITATIONS:  assisting family member with transfers  PERSONAL FACTORS Behavior pattern and 1 comorbidity: autism  are also affecting patient's functional outcome.   REHAB POTENTIAL: Fair PT not needed further  CLINICAL DECISION MAKING:  Stable/uncomplicated  EVALUATION COMPLEXITY: Low   GOALS: Goals reviewed with patient? No  SHORT TERM GOALS: Target date:  09/05/22   Patient will be given resources for further info regarding her condition, HEP and body mechanics  Baseline:unknown Goal status: MET   PLAN: PT FREQUENCY: one time visit  PT DURATION: other: NA  PLANNED INTERVENTIONS: Therapeutic exercises and Patient/Family education  PLAN FOR NEXT SESSION: NA, PT not indicated   Jackelin Correia, PT 09/05/2022, 1:33 PM   Raeford Razor, PT 09/05/22 1:53 PM Phone: [phone] Fax: [phone]

## 2022-12-22 ENCOUNTER — Ambulatory Visit: Payer: Medicare Other | Admitting: Internal Medicine

## 2023-02-22 ENCOUNTER — Other Ambulatory Visit (HOSPITAL_COMMUNITY)
Admission: RE | Admit: 2023-02-22 | Discharge: 2023-02-22 | Disposition: A | Discharge status: Home / Self Care | Payer: Medicare HMO | Source: Ambulatory Visit | Attending: Nurse Practitioner | Admitting: Nurse Practitioner

## 2023-02-22 ENCOUNTER — Other Ambulatory Visit: Payer: Self-pay | Admitting: Nurse Practitioner

## 2023-02-22 DIAGNOSIS — Z124 Encounter for screening for malignant neoplasm of cervix: Secondary | ICD-10-CM | POA: Diagnosis present

## 2023-02-27 LAB — CYTOLOGY - PAP: Diagnosis: NEGATIVE

## 2023-02-28 ENCOUNTER — Telehealth: Payer: Self-pay | Admitting: Psychology

## 2023-03-05 ENCOUNTER — Other Ambulatory Visit: Payer: Self-pay | Admitting: Internal Medicine

## 2023-03-08 ENCOUNTER — Other Ambulatory Visit: Payer: Self-pay

## 2023-03-30 ENCOUNTER — Ambulatory Visit (INDEPENDENT_AMBULATORY_CARE_PROVIDER_SITE_OTHER): Payer: Medicare HMO | Admitting: Internal Medicine

## 2023-03-30 ENCOUNTER — Encounter: Payer: Self-pay | Admitting: Internal Medicine

## 2023-03-30 VITALS — BP 110/76 | HR 68 | Resp 20 | Ht 61.0 in | Wt 140.0 lb

## 2023-03-30 DIAGNOSIS — F419 Anxiety disorder, unspecified: Secondary | ICD-10-CM

## 2023-03-30 DIAGNOSIS — F32A Depression, unspecified: Secondary | ICD-10-CM | POA: Diagnosis not present

## 2023-03-30 DIAGNOSIS — F840 Autistic disorder: Secondary | ICD-10-CM | POA: Diagnosis not present

## 2023-03-30 NOTE — Progress Notes (Signed)
    Subjective:    Patient ID: Sylvia [name], female   DOB: [birth year], 27 y.o.   MRN: [id number]   HPI  Only here to get set up with counselor/psychiatrist.   Depression/anxiety/ADHD:  was being followed at [place] and counselor was changing frequently and then when transferred care to [facility], she ran into same issue.   She transferred to [facility] with all of her care as we did not have a counselor at the time.   She found out in past month we now have a counselor and she wanted to return to care here.   Today, however, she is more focused on finding a psychiatrist, preferably female, to perform a full psychiatric evaluation.   Discussed she may consider working with Gala Romney, our [place], if unable to find the psychiatrist she would like.   Concerned with something she may be repressing since childhood and would like to be evaluated and treated for that as well.  Did not want to share today.   Denies thoughts of suicide, but has had depression symptoms recently.  Current Meds  Medication Sig   azelastine (ASTELIN) 0.1 % nasal spray 1-2 sprays each nostril twice daily as needed   cloNIDine (CATAPRES) 0.1 MG tablet 1 tab by mouth at bedtime nightly   EPINEPHrine 0.3 mg/0.3 mL IJ SOAJ injection Inject 0.3 mg into the muscle as needed for anaphylaxis.   escitalopram (LEXAPRO) 20 MG tablet TAKE 1 TABLET(20 MG) BY MOUTH AT BEDTIME   fluticasone (FLONASE) 50 MCG/ACT nasal spray 2 sprays each nostril once daily.   ibuprofen (ADVIL) 800 MG tablet Take 1 tablet (800 mg total) by mouth every 8 (eight) hours as needed (pain).   levocetirizine (XYZAL) 5 MG tablet Take 1 tablet (5 mg total) by mouth every evening.   mometasone (ELOCON) 0.1 % cream 1 application to affected areas twice daily as needed.   tiZANidine (ZANAFLEX) 4 MG tablet Take 1 tablet (4 mg total) by mouth every 8 (eight) hours as needed for muscle spasms.   No Known Allergies   Review of  Systems    Objective:   BP 110/76 (BP Location: Right Arm, Patient Position: Sitting, Cuff Size: Normal)   Pulse 68   Resp 20   Ht 5\' 1"  (1.549 m)   Wt 140 lb (63.5 kg)   BMI 26.45 kg/m   Physical Exam NAD Lungs:  CTA CV:  RRR without murmur or rub.   Assessment & Plan  Patient with autism spectrum, depression, anxiety, ADHD. Will see if Dr. Pearson Grippe is taking new patients. Emeterio Reeve Trogdon did perform warm hand off and shared she would be looking into possible candidates for care of these diagnoses and get back to both Ms. Bubier and me.

## 2023-04-07 ENCOUNTER — Other Ambulatory Visit: Payer: Self-pay

## 2023-04-07 MED ORDER — ESCITALOPRAM OXALATE 20 MG PO TABS: ORAL_TABLET | ORAL | 11 refills | Status: DC

## 2023-09-11 NOTE — Therapy (Signed)
OUTPATIENT PHYSICAL THERAPY THORACOLUMBAR EVALUATION   Patient Name: Sylvia [name] MRN: [id number] DOB:1996-01-09, 27 y.o., female Today's Date: 09/11/2023  END OF SESSION:   Past Medical History:  Diagnosis Date   ADHD    Anxiety    [name] is her counselor at [facility] [place].  [name] prescribes her mental health meds.   Autism spectrum    High functioning   Depression    [name] at [facility] the [place]   Past Surgical History:  Procedure Laterality Date   EYE SURGERY  2001   strabismus corrective surgery   WISDOM TOOTH EXTRACTION  2012   Patient Active Problem List   Diagnosis Date Noted   ADHD 03/04/2022   Environmental allergies 10/02/2018   Depression    Autism spectrum    Autism spectrum disorder 12/23/2013   Anxiety state, unspecified 12/23/2013   Behavior disturbance 12/23/2013   Outbursts of anger 12/23/2013   Tremor 12/23/2013    PCP: Julieanne Manson, MD  REFERRING PROVIDER: Pincus Badder, FNP  REFERRING DIAG: M54.50 (ICD-10-CM) - Low back pain, unspecified  Rationale for Evaluation and Treatment: Rehabilitation  THERAPY DIAG:  No diagnosis found.  ONSET DATE: ***  SUBJECTIVE:                                                                                                                                                                                           SUBJECTIVE STATEMENT: ***  PERTINENT HISTORY:  ADHD, anxiety/depression  PAIN:  Are you having pain: *** Location/description: *** Best-worst over past week: ***  - aggravating factors: *** - Easing factors: ***    PRECAUTIONS: None   WEIGHT BEARING RESTRICTIONS: No  FALLS:  Has patient fallen in last 6 months? {fallsyesno:27318}  LIVING ENVIRONMENT: Lives with: {[facility] lives with:25569::"lives with their family"} Lives in: {Lives in:25570} Stairs: {opstairs:27293} Has following equipment at home: {Assistive devices:23999}  OCCUPATION:  ***  PLOF: {PLOF:24004}  PATIENT GOALS: ***  NEXT MD VISIT: ***  OBJECTIVE:   DIAGNOSTIC FINDINGS:  No recent spine imaging in chart  PATIENT SURVEYS:  FOTO ***  SCREENING FOR RED FLAGS: Red flag questioning/screening reassuring ***   COGNITION: Overall cognitive status: {cognition:24006}     SENSATION: {sensation:27233}  MUSCLE LENGTH: Hamstrings: Right *** deg; Left *** deg Thomas test: Right *** deg; Left *** deg  POSTURE: {posture:25561}  PALPATION: ***  LUMBAR ROM:   AROM eval  Flexion   Extension   Right lateral flexion   Left lateral flexion   Right rotation   Left rotation    (Blank rows = not tested) (Key: WFL = within functional limits not  formally assessed, * = concordant pain, s = stiffness/stretching sensation, NT = not tested)   LOWER EXTREMITY ROM:     {AROM/PROM:27142}  Right eval Left eval  Hip flexion    Hip extension    Hip internal rotation    Hip external rotation    Knee extension    Knee flexion    (Blank rows = not tested) (Key: WFL = within functional limits not formally assessed, * = concordant pain, s = stiffness/stretching sensation, NT = not tested)  Comments:    LOWER EXTREMITY MMT:    MMT Right eval Left eval  Hip flexion    Hip abduction (modified sitting)    Hip internal rotation    Hip external rotation    Knee flexion    Knee extension    Ankle dorsiflexion     (Blank rows = not tested) (Key: WFL = within functional limits not formally assessed, * = concordant pain, s = stiffness/stretching sensation, NT = not tested)  Comments:    LUMBAR SPECIAL TESTS:  {lumbar special test:25242}  FUNCTIONAL TESTS:  {Functional tests:24029}  GAIT: Distance walked: within clinic Assistive device utilized: {Assistive devices:23999} Level of assistance: {Levels of assistance:24026} Comments: ***  TODAY'S TREATMENT:                                                                                                                               [facility] Adult PT Treatment:                                                DATE: 09/12/23 Therapeutic Exercise: *** Manual Therapy: *** Neuromuscular re-ed: *** Therapeutic Activity: *** Modalities: *** Self Care: ***    PATIENT EDUCATION:  Education details: Pt education on PT impairments, prognosis, and POC. Informed consent. Rationale for interventions, safe/appropriate HEP performance Person educated: Patient Education method: Explanation, Demonstration, Tactile cues, Verbal cues, and Handouts Education comprehension: verbalized understanding, returned demonstration, verbal cues required, tactile cues required, and needs further education    HOME EXERCISE PROGRAM: ***  ASSESSMENT:  CLINICAL IMPRESSION: Patient is a 27 y.o. woman who was seen today for physical therapy evaluation and treatment for low back pain. ***    OBJECTIVE IMPAIRMENTS: {opptimpairments:25111}.   ACTIVITY LIMITATIONS: {activitylimitations:27494}  PARTICIPATION LIMITATIONS: {participationrestrictions:25113}  PERSONAL FACTORS: {Personal factors:25162} are also affecting patient's functional outcome.   REHAB POTENTIAL: {rehabpotential:25112}  CLINICAL DECISION MAKING: {clinical decision making:25114}  EVALUATION COMPLEXITY: {Evaluation complexity:25115}   GOALS: Goals reviewed with patient? {yes/no:20286}  SHORT TERM GOALS: Target date: ***  Pt will demonstrate appropriate understanding and performance of initially prescribed HEP in order to facilitate improved independence with management of symptoms.  Baseline: HEP provided on eval Goal status: INITIAL   2. Pt will score greater than or equal to *** on FOTO in order to demonstrate  improved perception of function due to symptoms.  Baseline: ***  Goal status: INITIAL    LONG TERM GOALS: Target date: *** Pt will score *** on FOTO in order to demonstrate improved perception of functional status due to symptoms.   Baseline: *** Goal status: INITIAL  2.  Pt will demonstrate *** lumbar AROM in order to demonstrate improved tolerance to functional movement patterns.   Baseline: *** Goal status: INITIAL  3.  Pt will demonstrate hip MMT of *** in order to demonstrate improved strength for functional movements.  Baseline: *** Goal status: INITIAL  4. Pt will perform 5xSTS in <*** sec in order to demonstrate reduced fall risk and improved functional independence. (MCID of 2.3sec)  Baseline: ***  Goal status: INITIAL   PLAN:  PT FREQUENCY: {rehab frequency:25116}  PT DURATION: {rehab duration:25117}  PLANNED INTERVENTIONS: {rehab planned interventions:25118::"Therapeutic exercises","Therapeutic activity","Neuromuscular re-education","Balance training","Gait training","Patient/Family education","Self Care","Joint mobilization"}.  PLAN FOR NEXT SESSION: Review/update HEP PRN. Work on [employer] exercises as appropriate with emphasis on ***. Symptom modification strategies as indicated/appropriate.    Ashley Murrain PT, DPT 09/11/2023 7:49 AM

## 2023-09-12 ENCOUNTER — Ambulatory Visit: Payer: Medicare HMO | Attending: Family Medicine | Admitting: Physical Therapy

## 2023-09-12 DIAGNOSIS — M5459 Other low back pain: Secondary | ICD-10-CM | POA: Insufficient documentation

## 2023-09-12 DIAGNOSIS — M6281 Muscle weakness (generalized): Secondary | ICD-10-CM | POA: Insufficient documentation

## 2023-09-21 ENCOUNTER — Ambulatory Visit: Payer: Medicare HMO

## 2023-09-21 ENCOUNTER — Other Ambulatory Visit: Payer: Self-pay

## 2023-09-21 DIAGNOSIS — M6281 Muscle weakness (generalized): Secondary | ICD-10-CM | POA: Diagnosis present

## 2023-09-21 DIAGNOSIS — M5459 Other low back pain: Secondary | ICD-10-CM

## 2023-09-28 ENCOUNTER — Ambulatory Visit: Payer: Medicare HMO | Attending: Family Medicine

## 2023-09-28 DIAGNOSIS — M6281 Muscle weakness (generalized): Secondary | ICD-10-CM | POA: Diagnosis present

## 2023-09-28 DIAGNOSIS — M5459 Other low back pain: Secondary | ICD-10-CM | POA: Diagnosis present

## 2023-09-28 NOTE — Therapy (Signed)
OUTPATIENT PHYSICAL THERAPY TREATMENT NOTE   Patient Name: Sylvia [name] MRN: [id number] DOB:[birth year], 27 y.o., female Today's Date: 09/28/2023  END OF SESSION:  PT End of Session - 09/28/23 1530     Visit Number 2    Number of Visits 5    Date for PT Re-Evaluation 10/27/23    Authorization Type humana medicare    PT Start Time 1530    PT Stop Time 1553    PT Time Calculation (min) 23 min    Activity Tolerance Patient tolerated treatment well    Behavior During Therapy WFL for tasks assessed/performed              Past Medical History:  Diagnosis Date   ADHD    Anxiety    [name] is her counselor at [facility] of [place].  [name] prescribes her mental health meds.   Autism spectrum    High functioning   Depression    [name] at [facility] the [place]   Past Surgical History:  Procedure Laterality Date   EYE SURGERY  2001   strabismus corrective surgery   WISDOM TOOTH EXTRACTION  2012   Patient Active Problem List   Diagnosis Date Noted   ADHD 03/04/2022   Environmental allergies 10/02/2018   Depression    Autism spectrum    Autism spectrum disorder 12/23/2013   Anxiety state 12/23/2013   Behavior disturbance 12/23/2013   Outbursts of anger 12/23/2013   Tremor 12/23/2013    PCP: Julieanne Manson, MD  REFERRING PROVIDER: Pincus Badder, FNP   REFERRING DIAG: M54.50 (ICD-10-CM) - Low back pain, unspecified   Rationale for Evaluation and Treatment: Rehabilitation  THERAPY DIAG:  Other low back pain  Muscle weakness (generalized)  ONSET DATE: December 2023  SUBJECTIVE:                                                                                                                                                                                           SUBJECTIVE STATEMENT: Patient reports that she has been having symptoms of vertigo over the past week. She states that her lower back is not hurting as much today.      PERTINENT HISTORY:  PMHx includes ADHD, Anxiety, Autism Spectrum, Depression  PAIN:  Are you having pain? Yes: NPRS scale: 5-6/10 Pain location: bil lower back and hips  Pain description: sharp, aching, occasionally LE soreness Aggravating factors: prolonged standing  Relieving factors: medication, lying on my back  PRECAUTIONS: None  RED FLAGS: None   WEIGHT BEARING RESTRICTIONS: No  FALLS:  Has patient fallen in last 6 months? No  LIVING ENVIRONMENT: Lives  with: lives with their family  OCCUPATION: n/a   PLOF: Independent  PATIENT GOALS: To have less low back pain with daily activities and recreational activities, ability to return to roller skating without LBP or fear of falling   NEXT MD VISIT: 10/02/23  OBJECTIVE:   DIAGNOSTIC FINDINGS:  Per MD referral: presence of Lumbar DDD, L5-S1 with facet arthrosis also present at L4-5 and L5-S1.   PATIENT SURVEYS:  FOTO 94%  COGNITION: Overall cognitive status: Within functional limits for tasks assessed     SENSATION: Not tested  POSTURE: rounded shoulders and forward head  PALPATION: Mild tenderness to palpation L3-L5 CPAs   LUMBAR ROM:   AROM eval  Flexion 100%  Extension 100%  Right lateral flexion 100%  Left lateral flexion 100%  Right rotation 100%  Left rotation 100%   (Blank rows = not tested)  ABDOMINAL STRENGTH: 4/5 MMT in standard test position  LOWER EXTREMITY MMT:    MMT Right eval Left eval  Hip flexion 4+ 5  Hip extension 5 4+  Hip abduction    Hip adduction    Hip internal rotation    Hip external rotation    Knee flexion 5 5  Knee extension 5 5  Ankle dorsiflexion 5 5  Ankle plantarflexion    Ankle inversion    Ankle eversion     (Blank rows = not tested)  LUMBAR SPECIAL TESTS:  Straight leg raise test: Negative and Single leg stance test: Negative     TODAY'S TREATMENT:     [facility] Adult PT Treatment:                                                DATE:  09/28/23 Therapeutic Exercise: Standing hip abduction/extension RTB at ankles 2x10 ea BIL Step up with march 8" x10 BIL Self Care: Discussion of vertigo and vestibular therapy, plan to add on to future sessions when referral received                                                                                                                              [facility] Adult PT Treatment:                                                DATE: 09/21/2023   Initial evaluation: see patient education and home exercise program as noted below     PATIENT EDUCATION:  Education details: reviewed initial home exercise program; discussion of POC, prognosis and goals for skilled PT   Person educated: Patient and Parent Education method: Explanation, Demonstration, and Handouts Education comprehension: verbalized understanding, returned demonstration, and needs further education  HOME EXERCISE PROGRAM: To be provided at  next visit   ASSESSMENT:  CLINICAL IMPRESSION: Patient presents to PT reporting minimal current back pain, but that she has had symptoms of vertigo over the past week. She plans to reach out to her MD regarding this to obtain a referral to PT for her vertigo, will add onto POC once received. Session today focused on proximal hip and LE strengthening but she needed to end the session early due to increased feelings of nausea. Patient continues to benefit from skilled PT services and should be progressed as able to improve functional independence.    OBJECTIVE IMPAIRMENTS: decreased activity tolerance, decreased strength, postural dysfunction, and pain.   ACTIVITY LIMITATIONS: carrying, squatting, and stairs  PARTICIPATION LIMITATIONS:  recreational activities   PERSONAL FACTORS: Fitness, Past/current experiences, Time since onset of injury/illness/exacerbation, and 1-2 comorbidities: PMHx includes ADHD, Anxiety, Autism Spectrum, Depression  are also affecting patient's functional outcome.    REHAB POTENTIAL: Good  CLINICAL DECISION MAKING: Stable/uncomplicated  EVALUATION COMPLEXITY: Low   GOALS: Goals reviewed with patient? No   LONG TERM GOALS: Target date: 10/27/2023   Patient will be independent with initial home program for core strengthening program.  Baseline: to be initiated and f/u visit.  Goal status: INITIAL  2.  Patient will have 5/5 MMT score with BIL hip strength testing.  Baseline:  MMT Right eval Left eval  Hip flexion 4+ 5  Hip extension 5 4+   Goal status: INITIAL  3.  Patient will report improved confidence to >/= 5/10 with ability to return to roller skating and ability to brace for unexpected falls.  Baseline: <3/10 Goal status: INITIAL  PLAN:  PT FREQUENCY: 1x/week  PT DURATION: 5 weeks  PLANNED INTERVENTIONS: Therapeutic exercises, Therapeutic activity, Neuromuscular re-education, Balance training, Patient/Family education, Self Care, Joint mobilization, Electrical stimulation, Spinal manipulation, Spinal mobilization, Cryotherapy, Moist heat, Traction, Manual therapy, and Re-evaluation.  Referring diagnosis? M54.50 (ICD-10-CM) - Low back pain, unspecified  Treatment diagnosis? (if different than referring diagnosis)  Other low back pain Muscle weakness (generalized)  What was this (referring dx) caused by? []  Surgery [x]  Fall [x]  Ongoing issue []  Arthritis []  Other: ____________  Laterality: []  Rt []  Lt [x]  Both  Check all possible CPT codes:  *CHOOSE 10 OR LESS*    [x]  78295 (Therapeutic Exercise)  []  92507 (SLP Treatment)  [x]  62130 (Neuro Re-ed)   []  92526 (Swallowing Treatment)   []  86578 (Gait Training)   []  [id number] (Cognitive Training, 1st 15 minutes) [x]  97140 (Manual Therapy)   []  97130 (Cognitive Training, each add'l 15 minutes)  [x]  97164 (Re-evaluation)                              []  Other, List CPT Code ____________  [x]  97530 (Therapeutic Activities)     [x]  97535 (Self Care)   []  All codes above  (97110 - 97535)  [x]  97012 (Mechanical Traction)  [x]  97014 (E-stim Unattended)  []  97032 (E-stim manual)  []  97033 (Ionto)  []  97035 (Ultrasound) []  97750 (Physical Performance Training) []  [id number] (Aquatic Therapy) []  97016 (Vasopneumatic Device) []  [id number] (Paraffin) []  97034 (Contrast Bath) []  97597 (Wound Care 1st 20 sq cm) []  97598 (Wound Care each add'l 20 sq cm) []  97760 (Orthotic Fabrication, Fitting, Training Initial) []  [id number] (Prosthetic Management and Training Initial) []  [id number] (Orthotic or Prosthetic Training/ Modification Subsequent)   PLAN FOR NEXT SESSION: high-level core strengthening program and reactionary balance training, including standing core, dynamic balance and  navigation of uneven and compliance surfaces; create HEP and review   Berta Minor PTA 09/28/2023, 3:58 PM

## 2023-10-02 ENCOUNTER — Ambulatory Visit (INDEPENDENT_AMBULATORY_CARE_PROVIDER_SITE_OTHER): Payer: Medicare HMO | Admitting: Internal Medicine

## 2023-10-02 ENCOUNTER — Encounter: Payer: Self-pay | Admitting: Internal Medicine

## 2023-10-02 VITALS — BP 114/76 | HR 92 | Resp 16 | Ht 61.0 in | Wt 148.0 lb

## 2023-10-02 DIAGNOSIS — F32A Depression, unspecified: Secondary | ICD-10-CM | POA: Diagnosis not present

## 2023-10-02 DIAGNOSIS — F419 Anxiety disorder, unspecified: Secondary | ICD-10-CM

## 2023-10-02 DIAGNOSIS — R42 Dizziness and giddiness: Secondary | ICD-10-CM | POA: Diagnosis not present

## 2023-10-02 DIAGNOSIS — F840 Autistic disorder: Secondary | ICD-10-CM | POA: Diagnosis not present

## 2023-10-02 DIAGNOSIS — M545 Low back pain, unspecified: Secondary | ICD-10-CM
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
## 2023-10-04 ENCOUNTER — Telehealth: Payer: Self-pay

## 2023-10-04 ENCOUNTER — Ambulatory Visit: Payer: Medicare HMO

## 2023-10-04 NOTE — Telephone Encounter (Signed)
Spoke with pt who had double booked appts. Reminded of next scheduled appt. - Sylvia [name]

## 2023-10-12 ENCOUNTER — Ambulatory Visit: Payer: Medicare HMO

## 2023-10-12 DIAGNOSIS — M5459 Other low back pain: Secondary | ICD-10-CM | POA: Diagnosis not present

## 2023-10-12 DIAGNOSIS — M6281 Muscle weakness (generalized): Secondary | ICD-10-CM

## 2023-10-12 NOTE — Therapy (Signed)
OUTPATIENT PHYSICAL THERAPY TREATMENT NOTE   Patient Name: Sylvia [name] MRN: [id number] DOB:04/21/1996, 27 y.o., female Today's Date: 10/12/2023  END OF SESSION:  PT End of Session - 10/12/23 1532     Visit Number 3    Number of Visits 5    Date for PT Re-Evaluation 10/27/23    Authorization Type humana medicare    PT Start Time 1531    PT Stop Time 1609    PT Time Calculation (min) 38 min    Activity Tolerance Patient tolerated treatment well    Behavior During Therapy WFL for tasks assessed/performed             Past Medical History:  Diagnosis Date   ADHD    Anxiety    [name] is her counselor at [facility].  [name] prescribes her mental health meds.   Autism spectrum    High functioning   Depression    [name] at [facility] the [place]   Past Surgical History:  Procedure Laterality Date   EYE SURGERY  2001   strabismus corrective surgery   WISDOM TOOTH EXTRACTION  2012   Patient Active Problem List   Diagnosis Date Noted   ADHD 03/04/2022   Environmental allergies 10/02/2018   Depression    Autism spectrum    Autism spectrum disorder 12/23/2013   Anxiety state 12/23/2013   Behavior disturbance 12/23/2013   Outbursts of anger 12/23/2013   Tremor 12/23/2013    PCP: Julieanne Manson, MD  REFERRING PROVIDER: Pincus Badder, FNP   REFERRING DIAG: M54.50 (ICD-10-CM) - Low back pain, unspecified   Rationale for Evaluation and Treatment: Rehabilitation  THERAPY DIAG:  Other low back pain  Muscle weakness (generalized)  ONSET DATE: December 2023  SUBJECTIVE:                                                                                                                                                                                           SUBJECTIVE STATEMENT: Patient reports that her back is feeling good today and that her dizziness has improved since seeing her MD about her vertigo.    PERTINENT HISTORY:   PMHx includes ADHD, Anxiety, Autism Spectrum, Depression  PAIN:  Are you having pain? Yes: NPRS scale: 5-6/10 Pain location: bil lower back and hips  Pain description: sharp, aching, occasionally LE soreness Aggravating factors: prolonged standing  Relieving factors: medication, lying on my back  PRECAUTIONS: None  RED FLAGS: None   WEIGHT BEARING RESTRICTIONS: No  FALLS:  Has patient fallen in last 6 months? No  LIVING ENVIRONMENT: Lives with: lives with their family  OCCUPATION: n/a   PLOF: Independent  PATIENT GOALS: To have less low back pain with daily activities and recreational activities, ability to return to roller skating without LBP or fear of falling   NEXT MD VISIT: 10/02/23  OBJECTIVE:   DIAGNOSTIC FINDINGS:  Per MD referral: presence of Lumbar DDD, L5-S1 with facet arthrosis also present at L4-5 and L5-S1.   PATIENT SURVEYS:  FOTO 94%  COGNITION: Overall cognitive status: Within functional limits for tasks assessed     SENSATION: Not tested  POSTURE: rounded shoulders and forward head  PALPATION: Mild tenderness to palpation L3-L5 CPAs   LUMBAR ROM:   AROM eval  Flexion 100%  Extension 100%  Right lateral flexion 100%  Left lateral flexion 100%  Right rotation 100%  Left rotation 100%   (Blank rows = not tested)  ABDOMINAL STRENGTH: 4/5 MMT in standard test position  LOWER EXTREMITY MMT:    MMT Right eval Left eval  Hip flexion 4+ 5  Hip extension 5 4+  Hip abduction    Hip adduction    Hip internal rotation    Hip external rotation    Knee flexion 5 5  Knee extension 5 5  Ankle dorsiflexion 5 5  Ankle plantarflexion    Ankle inversion    Ankle eversion     (Blank rows = not tested)  LUMBAR SPECIAL TESTS:  Straight leg raise test: Negative and Single leg stance test: Negative     TODAY'S TREATMENT:     [facility] Adult PT Treatment:                                                DATE: 10/12/23 Therapeutic  Exercise: Standing hip abduction/extension 2x10 ea BIL Step up with march 8" x10 BIL Lateral step ups 8" x10 BIL Bridges 2x10 SLR 2x10 BIL Hip adduction 5" hold 2x10 LTR x10 BIL Supine pball TA activation 3" hold 2x10   [facility] Adult PT Treatment:                                                DATE: 09/28/23 Therapeutic Exercise: Standing hip abduction/extension RTB at ankles 2x10 ea BIL Step up with march 8" x10 BIL Self Care: Discussion of vertigo and vestibular therapy, plan to add on to future sessions when referral received                                                                                                                              [facility] Adult PT Treatment:  DATE: 09/21/2023   Initial evaluation: see patient education and home exercise program as noted below     PATIENT EDUCATION:  Education details: reviewed initial home exercise program; discussion of POC, prognosis and goals for skilled PT   Person educated: Patient and Parent Education method: Explanation, Demonstration, and Handouts Education comprehension: verbalized understanding, returned demonstration, and needs further education  HOME EXERCISE PROGRAM: Access Code: [email or web address] URL: [email or web address] Date: 10/12/2023 Prepared by: Berta Minor  Exercises - Supine Bridge  - 1 x daily - 7 x weekly - 3 sets - 10 reps - Supine Active Straight Leg Raise  - 1 x daily - 7 x weekly - 2 sets - 10 reps  ASSESSMENT:  CLINICAL IMPRESSION: Patient presents to PT reporting improved lower back pain and vertigo symptoms have also subsided since she recently saw her MD. Session today focused on proximal hip and core strengthening. Provided HEP this session with patient demonstrating understanding of each exercise. Patient was able to tolerate all prescribed exercises with no adverse effects. Patient continues to benefit from skilled PT services and  should be progressed as able to improve functional independence.    OBJECTIVE IMPAIRMENTS: decreased activity tolerance, decreased strength, postural dysfunction, and pain.   ACTIVITY LIMITATIONS: carrying, squatting, and stairs  PARTICIPATION LIMITATIONS:  recreational activities   PERSONAL FACTORS: Fitness, Past/current experiences, Time since onset of injury/illness/exacerbation, and 1-2 comorbidities: PMHx includes ADHD, Anxiety, Autism Spectrum, Depression  are also affecting patient's functional outcome.   REHAB POTENTIAL: Good  CLINICAL DECISION MAKING: Stable/uncomplicated  EVALUATION COMPLEXITY: Low   GOALS: Goals reviewed with patient? No   LONG TERM GOALS: Target date: 10/27/2023   Patient will be independent with initial home program for core strengthening program.  Baseline: to be initiated and f/u visit.  Goal status: INITIAL  2.  Patient will have 5/5 MMT score with BIL hip strength testing.  Baseline:  MMT Right eval Left eval  Hip flexion 4+ 5  Hip extension 5 4+   Goal status: INITIAL  3.  Patient will report improved confidence to >/= 5/10 with ability to return to roller skating and ability to brace for unexpected falls.  Baseline: <3/10 Goal status: INITIAL  PLAN:  PT FREQUENCY: 1x/week  PT DURATION: 5 weeks  PLANNED INTERVENTIONS: Therapeutic exercises, Therapeutic activity, Neuromuscular re-education, Balance training, Patient/Family education, Self Care, Joint mobilization, Electrical stimulation, Spinal manipulation, Spinal mobilization, Cryotherapy, Moist heat, Traction, Manual therapy, and Re-evaluation.  Referring diagnosis? M54.50 (ICD-10-CM) - Low back pain, unspecified  Treatment diagnosis? (if different than referring diagnosis)  Other low back pain Muscle weakness (generalized)  What was this (referring dx) caused by? []  Surgery [x]  Fall [x]  Ongoing issue []  Arthritis []  Other: ____________  Laterality: []  Rt []  Lt [x]   Both  Check all possible CPT codes:  *CHOOSE 10 OR LESS*    [x]  97110 (Therapeutic Exercise)  []  92507 (SLP Treatment)  [x]  97112 (Neuro Re-ed)   []  92526 (Swallowing Treatment)   []  95284 (Gait Training)   []  [id number] (Cognitive Training, 1st 15 minutes) [x]  97140 (Manual Therapy)   []  97130 (Cognitive Training, each add'l 15 minutes)  [x]  97164 (Re-evaluation)                              []  Other, List CPT Code ____________  [x]  97530 (Therapeutic Activities)     [x]  97535 (Self Care)   []  All codes above (97110 - 97535)  [x]  97012 (  Mechanical Traction)  [x]  97014 (E-stim Unattended)  []  97032 (E-stim manual)  []  97033 (Ionto)  []  97035 (Ultrasound) []  97750 (Physical Performance Training) []  [id number] (Aquatic Therapy) []  16109 (Vasopneumatic Device) []  [id number] (Paraffin) []  97034 (Contrast Bath) []  97597 (Wound Care 1st 20 sq cm) []  97598 (Wound Care each add'l 20 sq cm) []  97760 (Orthotic Fabrication, Fitting, Training Initial) []  [id number] (Prosthetic Management and Training Initial) []  [id number] (Orthotic or Prosthetic Training/ Modification Subsequent)   PLAN FOR NEXT SESSION: high-level core strengthening program and reactionary balance training, including standing core, dynamic balance and navigation of uneven and compliance surfaces; create HEP and review   Berta Minor PTA 10/12/2023, 4:05 PM

## 2023-10-19 ENCOUNTER — Ambulatory Visit: Payer: Medicare HMO

## 2023-10-19 DIAGNOSIS — M5459 Other low back pain: Secondary | ICD-10-CM | POA: Diagnosis not present

## 2023-10-19 DIAGNOSIS — M6281 Muscle weakness (generalized): Secondary | ICD-10-CM

## 2023-10-19 NOTE — Therapy (Signed)
OUTPATIENT PHYSICAL THERAPY TREATMENT NOTE   Patient Name: Sylvia [name] MRN: [id number] DOB:12-22-1996, 27 y.o., female Today's Date: 10/19/2023  END OF SESSION:  PT End of Session - 10/19/23 1033     Visit Number 4    Number of Visits 5    Date for PT Re-Evaluation 10/27/23    Authorization Type humana medicare    PT Start Time 1040    PT Stop Time 1118    PT Time Calculation (min) 38 min    Activity Tolerance Patient tolerated treatment well    Behavior During Therapy WFL for tasks assessed/performed             Past Medical History:  Diagnosis Date   ADHD    Anxiety    [name] is her counselor at [facility] [place].  [name] prescribes her mental health meds.   Autism spectrum    High functioning   Depression    [name] at [facility] the [place]   Past Surgical History:  Procedure Laterality Date   EYE SURGERY  2001   strabismus corrective surgery   WISDOM TOOTH EXTRACTION  2012   Patient Active Problem List   Diagnosis Date Noted   ADHD 03/04/2022   Environmental allergies 10/02/2018   Depression    Autism spectrum    Autism spectrum disorder 12/23/2013   Anxiety state 12/23/2013   Behavior disturbance 12/23/2013   Outbursts of anger 12/23/2013   Tremor 12/23/2013    PCP: Julieanne Manson, MD  REFERRING PROVIDER: Pincus Badder, FNP   REFERRING DIAG: M54.50 (ICD-10-CM) - Low back pain, unspecified   Rationale for Evaluation and Treatment: Rehabilitation  THERAPY DIAG:  Other low back pain  Muscle weakness (generalized)  ONSET DATE: December 2023  SUBJECTIVE:                                                                                                                                                                                           SUBJECTIVE STATEMENT: Patient reports no current pain and no pain over the past week. States her vertigo symptoms have continued to improve as well.    PERTINENT HISTORY:   PMHx includes ADHD, Anxiety, Autism Spectrum, Depression  PAIN:  Are you having pain? Yes: NPRS scale: 5-6/10 Pain location: bil lower back and hips  Pain description: sharp, aching, occasionally LE soreness Aggravating factors: prolonged standing  Relieving factors: medication, lying on my back  PRECAUTIONS: None  RED FLAGS: None   WEIGHT BEARING RESTRICTIONS: No  FALLS:  Has patient fallen in last 6 months? No  LIVING ENVIRONMENT: Lives with: lives with their family  OCCUPATION: n/a   PLOF: Independent  PATIENT GOALS: To have less low back pain with daily activities and recreational activities, ability to return to roller skating without LBP or fear of falling   NEXT MD VISIT: 10/02/23  OBJECTIVE:   DIAGNOSTIC FINDINGS:  Per MD referral: presence of Lumbar DDD, L5-S1 with facet arthrosis also present at L4-5 and L5-S1.   PATIENT SURVEYS:  FOTO 94%  COGNITION: Overall cognitive status: Within functional limits for tasks assessed     SENSATION: Not tested  POSTURE: rounded shoulders and forward head  PALPATION: Mild tenderness to palpation L3-L5 CPAs   LUMBAR ROM:   AROM eval  Flexion 100%  Extension 100%  Right lateral flexion 100%  Left lateral flexion 100%  Right rotation 100%  Left rotation 100%   (Blank rows = not tested)  ABDOMINAL STRENGTH: 4/5 MMT in standard test position  LOWER EXTREMITY MMT:    MMT Right eval Left eval  Hip flexion 4+ 5  Hip extension 5 4+  Hip abduction    Hip adduction    Hip internal rotation    Hip external rotation    Knee flexion 5 5  Knee extension 5 5  Ankle dorsiflexion 5 5  Ankle plantarflexion    Ankle inversion    Ankle eversion     (Blank rows = not tested)  LUMBAR SPECIAL TESTS:  Straight leg raise test: Negative and Single leg stance test: Negative    TODAY'S TREATMENT:     [facility] Adult PT Treatment:                                                DATE: 10/19/23 Therapeutic  Exercise: Nustep level 6 x 5 mins while gathering subjective info Standing hip abduction/extension YTB at ankles 2x10 ea BIL Step up with march 8" x10 BIL Lateral step ups 8" x10 BIL Bridges 2x10 SLR x10 BIL Hip adduction 5" hold 2x10 LTR x10 BIL Supine pball TA activation 3" hold 2x10   [facility] Adult PT Treatment:                                                DATE: 10/12/23 Therapeutic Exercise: Standing hip abduction/extension 2x10 ea BIL Step up with march 8" x10 BIL Lateral step ups 8" x10 BIL Bridges 2x10 SLR 2x10 BIL Hip adduction 5" hold 2x10 LTR x10 BIL Supine pball TA activation 3" hold 2x10   [facility] Adult PT Treatment:                                                DATE: 09/28/23 Therapeutic Exercise: Standing hip abduction/extension RTB at ankles 2x10 ea BIL Step up with march 8" x10 BIL Self Care: Discussion of vertigo and vestibular therapy, plan to add on to future sessions when referral received  PATIENT EDUCATION:  Education details: reviewed initial home exercise program; discussion of POC, prognosis and goals for skilled PT   Person educated: Patient and Parent Education method: Explanation, Demonstration, and Handouts Education comprehension: verbalized understanding, returned demonstration, and needs further education  HOME EXERCISE PROGRAM: Access Code: [email or web address] URL: [email or web address] Date: 10/12/2023 Prepared by: Berta Minor  Exercises - Supine Bridge  - 1 x daily - 7 x weekly - 3 sets - 10 reps - Supine Active Straight Leg Raise  - 1 x daily - 7 x weekly - 2 sets - 10 reps  ASSESSMENT:  CLINICAL IMPRESSION: Patient presents to PT reporting no current back pain and no pain over the past week as well as continued improvement in her vertigo symptoms. Session today continued to focus on proximal hip and core  strengthening. Will consider DC at next session if cessation of symptoms continues. Patient continues to benefit from skilled PT services and should be progressed as able to improve functional independence.    OBJECTIVE IMPAIRMENTS: decreased activity tolerance, decreased strength, postural dysfunction, and pain.   ACTIVITY LIMITATIONS: carrying, squatting, and stairs  PARTICIPATION LIMITATIONS:  recreational activities   PERSONAL FACTORS: Fitness, Past/current experiences, Time since onset of injury/illness/exacerbation, and 1-2 comorbidities: PMHx includes ADHD, Anxiety, Autism Spectrum, Depression  are also affecting patient's functional outcome.   REHAB POTENTIAL: Good  CLINICAL DECISION MAKING: Stable/uncomplicated  EVALUATION COMPLEXITY: Low   GOALS: Goals reviewed with patient? No   LONG TERM GOALS: Target date: 10/27/2023   Patient will be independent with initial home program for core strengthening program.  Baseline: to be initiated and f/u visit.  Goal status: MET Pt reports adherence 10/19/23  2.  Patient will have 5/5 MMT score with BIL hip strength testing.  Baseline:  MMT Right eval Left eval  Hip flexion 4+ 5  Hip extension 5 4+   Goal status: INITIAL  3.  Patient will report improved confidence to >/= 5/10 with ability to return to roller skating and ability to brace for unexpected falls.  Baseline: <3/10 Goal status: INITIAL  PLAN:  PT FREQUENCY: 1x/week  PT DURATION: 5 weeks  PLANNED INTERVENTIONS: Therapeutic exercises, Therapeutic activity, Neuromuscular re-education, Balance training, Patient/Family education, Self Care, Joint mobilization, Electrical stimulation, Spinal manipulation, Spinal mobilization, Cryotherapy, Moist heat, Traction, Manual therapy, and Re-evaluation.  PLAN FOR NEXT SESSION: high-level core strengthening program and reactionary balance training, including standing core, dynamic balance and navigation of uneven and  compliance surfaces; create HEP and review   Berta Minor PTA 10/19/2023, 11:23 AM

## 2023-10-26 ENCOUNTER — Ambulatory Visit: Payer: Medicare HMO

## 2023-10-26 DIAGNOSIS — M6281 Muscle weakness (generalized): Secondary | ICD-10-CM

## 2023-10-26 DIAGNOSIS — M5459 Other low back pain: Secondary | ICD-10-CM | POA: Diagnosis not present

## 2023-10-26 NOTE — Therapy (Addendum)
 PHYSICAL THERAPY DISCHARGE SUMMARY  Visits from Start of Care: 5  Patient is being discharged due to not returning since last visit (>60 days)    Arlester Bence, PT, DPT  05/01/2024 11:51 AM    OUTPATIENT PHYSICAL THERAPY TREATMENT NOTE   Patient Name: Sylvia [name] MRN: [id number] DOB:[birth year], 27 y.o., female Today's Date: 10/26/2023  END OF SESSION:  PT End of Session - 10/26/23 1526     Visit Number 5    Number of Visits 5    Date for PT Re-Evaluation 10/27/23    Authorization Type [employer] medicare    PT Start Time 1530    PT Stop Time 1544    PT Time Calculation (min) 14 min    Activity Tolerance Patient tolerated treatment well    Behavior During Therapy WFL for tasks assessed/performed              Past Medical History:  Diagnosis Date   ADHD    Anxiety    Mia Adam is her counselor at [facility] [place].  Stana Ear prescribes her mental health meds.   Autism spectrum    High functioning   Depression    [name] at [facility] of the [place]   Past Surgical History:  Procedure Laterality Date   EYE SURGERY  2001   strabismus corrective surgery   WISDOM TOOTH EXTRACTION  2012   Patient Active Problem List   Diagnosis Date Noted   ADHD 03/04/2022   Environmental allergies 10/02/2018   Depression    Autism spectrum    Autism spectrum disorder 12/23/2013   Anxiety state 12/23/2013   Behavior disturbance 12/23/2013   Outbursts of anger 12/23/2013   Tremor 12/23/2013    PCP: Ronalee Cocking, MD  REFERRING PROVIDER: Carylon Claude, FNP   REFERRING DIAG: M54.50 (ICD-10-CM) - Low back pain, unspecified   Rationale for Evaluation and Treatment: Rehabilitation  THERAPY DIAG:  Other low back pain  Muscle weakness (generalized)  ONSET DATE: December 2023  SUBJECTIVE:                                                                                                                                                                                            SUBJECTIVE STATEMENT:  Patient reports no current pain and no pain over the past week. She reports mild pain in her Lt hip that started today.   PERTINENT HISTORY:  PMHx includes ADHD, Anxiety, Autism Spectrum, Depression  PAIN:  Are you having pain? Yes: NPRS scale: 5-6/10 Pain location: bil lower back and hips  Pain description: sharp, aching, occasionally LE soreness Aggravating factors: prolonged standing  Relieving factors: medication, lying on my back  PRECAUTIONS: None  RED FLAGS: None   WEIGHT BEARING RESTRICTIONS: No  FALLS:  Has patient fallen in last 6 months? No  LIVING ENVIRONMENT: Lives with: lives with their family  OCCUPATION: n/a   PLOF: Independent  PATIENT GOALS: To have less low back pain with daily activities and recreational activities, ability to return to roller skating without LBP or fear of falling   NEXT MD VISIT: 10/02/23  OBJECTIVE:   DIAGNOSTIC FINDINGS:  Per MD referral: presence of Lumbar DDD, L5-S1 with facet arthrosis also present at L4-5 and L5-S1.   PATIENT SURVEYS:  FOTO 94%  COGNITION: Overall cognitive status: Within functional limits for tasks assessed     SENSATION: Not tested  POSTURE: rounded shoulders and forward head  PALPATION: Mild tenderness to palpation L3-L5 CPAs   LUMBAR ROM:   AROM eval  Flexion 100%  Extension 100%  Right lateral flexion 100%  Left lateral flexion 100%  Right rotation 100%  Left rotation 100%   (Blank rows = not tested)  ABDOMINAL STRENGTH: 4/5 MMT in standard test position  LOWER EXTREMITY MMT:    MMT Right eval Left eval Right 10/26/23 Left 10/26/23  Hip flexion 4+ 5 4+ 5  Hip extension 5 4+ 5 5  Hip abduction      Hip adduction      Hip internal rotation      Hip external rotation      Knee flexion 5 5    Knee extension 5 5    Ankle dorsiflexion 5 5    Ankle plantarflexion      Ankle inversion      Ankle eversion       (Blank rows = not  tested)  LUMBAR SPECIAL TESTS:  Straight leg raise test: Negative and Single leg stance test: Negative    TODAY'S TREATMENT:     [facility] Adult PT Treatment:                                                DATE: 10/26/23 Therapeutic Activity: Discussion of goals, review/update of HEP   [facility] Adult PT Treatment:                                                DATE: 10/19/23 Therapeutic Exercise: Nustep level 6 x 5 mins while gathering subjective info Standing hip abduction/extension YTB at ankles 2x10 ea BIL Step up with march 8" x10 BIL Lateral step ups 8" x10 BIL Bridges 2x10 SLR x10 BIL Hip adduction 5" hold 2x10 LTR x10 BIL Supine pball TA activation 3" hold 2x10   [facility] Adult PT Treatment:                                                DATE: 10/12/23 Therapeutic Exercise: Standing hip abduction/extension 2x10 ea BIL Step up with march 8" x10 BIL Lateral step ups 8" x10 BIL Bridges 2x10 SLR 2x10 BIL Hip adduction 5" hold 2x10 LTR x10 BIL Supine pball TA activation 3" hold 2x10  PATIENT EDUCATION:  Education details: reviewed initial home exercise program; discussion of POC, prognosis and goals for skilled PT   Person educated: Patient and Parent Education method: Explanation, Demonstration, and Handouts Education comprehension: verbalized understanding, returned demonstration, and needs further education  HOME EXERCISE PROGRAM: Access Code: [email or web address] URL: [email or web address] Date: 10/26/2023 Prepared by: Anna Kettering  Exercises - Supine Bridge  - 1 x daily - 7 x weekly - 3 sets - 10 reps - Supine Active Straight Leg Raise  - 1 x daily - 7 x weekly - 2 sets - 10 reps - Supine Shoulder Horizontal Abduction with Resistance  - 1 x daily - 7 x weekly - 2 sets - 10 reps  ASSESSMENT:  CLINICAL IMPRESSION: Patient presents to PT  reporting no current pain in her back, reports some mild pain in her Lt hip. She states that her back has not bothered her for several weeks. She is open to pausing PT for now and will call within 30 days if her pain returns. Updated her HEP this session with patient demonstrating understanding of each exercise.    OBJECTIVE IMPAIRMENTS: decreased activity tolerance, decreased strength, postural dysfunction, and pain.   ACTIVITY LIMITATIONS: carrying, squatting, and stairs  PARTICIPATION LIMITATIONS:  recreational activities   PERSONAL FACTORS: Fitness, Past/current experiences, Time since onset of injury/illness/exacerbation, and 1-2 comorbidities: PMHx includes ADHD, Anxiety, Autism Spectrum, Depression  are also affecting patient's functional outcome.   REHAB POTENTIAL: Good  CLINICAL DECISION MAKING: Stable/uncomplicated  EVALUATION COMPLEXITY: Low   GOALS: Goals reviewed with patient? No   LONG TERM GOALS: Target date: 10/27/2023   Patient will be independent with initial home program for core strengthening program.  Baseline: to be initiated and f/u visit.  Goal status: MET Pt reports adherence 10/19/23  2.  Patient will have 5/5 MMT score with BIL hip strength testing.  Baseline:  MMT Right eval Left eval  Hip flexion 4+ 5  Hip extension 5 4+   Goal status: Partially Met - see above chart  3.  Patient will report improved confidence to >/= 5/10 with ability to return to roller skating and ability to brace for unexpected falls.  Baseline: <3/10 Goal status: NOT MET  Pt reports that she has not tried as of 10/26/23  PLAN:  PT FREQUENCY: 1x/week  PT DURATION: 5 weeks  PLANNED INTERVENTIONS: Therapeutic exercises, Therapeutic activity, Neuromuscular re-education, Balance training, Patient/Family education, Self Care, Joint mobilization, Electrical stimulation, Spinal manipulation, Spinal mobilization, Cryotherapy, Moist heat, Traction, Manual therapy, and  Re-evaluation.  PLAN FOR NEXT SESSION: high-level core strengthening program and reactionary balance training, including standing core, dynamic balance and navigation of uneven and compliance surfaces; create HEP and review   Anna Kettering PTA 10/26/2023, 3:45 PM

## 2023-10-27 ENCOUNTER — Encounter (HOSPITAL_COMMUNITY): Payer: Self-pay

## 2023-10-27 ENCOUNTER — Ambulatory Visit (INDEPENDENT_AMBULATORY_CARE_PROVIDER_SITE_OTHER): Payer: Medicare HMO

## 2023-10-27 ENCOUNTER — Ambulatory Visit (HOSPITAL_COMMUNITY)
Admission: EM | Admit: 2023-10-27 | Discharge: 2023-10-27 | Disposition: A | Discharge status: Home / Self Care | Payer: Medicare HMO | Attending: Internal Medicine | Admitting: Internal Medicine

## 2023-10-27 DIAGNOSIS — R079 Chest pain, unspecified: Secondary | ICD-10-CM

## 2023-10-27 NOTE — Discharge Instructions (Addendum)
Tylenol for discomfort.  Follow up with your Physician for recheck.

## 2023-10-27 NOTE — ED Triage Notes (Addendum)
Chest pain started yesterday. No history of heart problems or chest pain. Pain with breathing and states her chest feels tense with any form of exertion. Central chest pain no radiation. No SOB or change in Level OC.

## 2023-10-27 NOTE — ED Provider Notes (Signed)
[facility]    CSN: [id number] Arrival date & time: 10/27/23  1454      History   Chief Complaint Chief Complaint  Patient presents with   Chest Pain    HPI Sylvia [name] is a 27 y.o. female.   Patient complains of pain in her chest.  She reports discomfort began after wearing a gas mask yesterday.  Patient has a mask that she bought at a sale.  Patient reports pain in the middle of her chest today.  Patient has experienced some shortness of breath.  Patient denies any fever or chills she has not any cough or congestion patient denies any exposure to flu or COVID.  The history is provided by the patient. No language interpreter was used.  Chest Pain   Past Medical History:  Diagnosis Date   ADHD    Anxiety    [name] is her counselor at [facility] [place].  [name] prescribes her mental health meds.   Autism spectrum    High functioning   Depression    [name] at [facility] of the [facility]    Patient Active Problem List   Diagnosis Date Noted   ADHD 03/04/2022   Environmental allergies 10/02/2018   Depression    Autism spectrum    Autism spectrum disorder 12/23/2013   Anxiety state 12/23/2013   Behavior disturbance 12/23/2013   Outbursts of anger 12/23/2013   Tremor 12/23/2013    Past Surgical History:  Procedure Laterality Date   EYE SURGERY  2001   strabismus corrective surgery   WISDOM TOOTH EXTRACTION  2012    OB History   No obstetric history on file.      Home Medications    Prior to Admission medications   Medication Sig Start Date End Date Taking? Authorizing Provider  cloNIDine (CATAPRES) 0.1 MG tablet 1 tab by mouth at bedtime nightly 03/04/22  Yes Julieanne Manson, MD  escitalopram (LEXAPRO) 20 MG tablet TAKE 1 TABLET(20 MG) BY MOUTH AT BEDTIME 04/07/23  Yes Julieanne Manson, MD  fluticasone [facility]) 50 MCG/ACT nasal spray 2 sprays each nostril once daily. 03/04/22  Yes Julieanne Manson, MD  ibuprofen (ADVIL)  800 MG tablet Take 1 tablet (800 mg total) by mouth every 8 (eight) hours as needed (pain). 08/07/22  Yes Banister, Janace Aris, MD  levocetirizine (XYZAL) 5 MG tablet Take 1 tablet (5 mg total) by mouth every evening. 03/04/22  Yes Julieanne Manson, MD  magnesium oxide (MAG-OX) 400 (240 Mg) MG tablet Take 1 tablet by mouth daily.   Yes [provider]  mometasone (ELOCON) 0.1 % cream 1 application to affected areas twice daily as needed. 03/04/22  Yes Julieanne Manson, MD  rizatriptan (MAXALT) 5 MG tablet Take 5 mg by mouth as needed.   Yes [provider]  tiZANidine (ZANAFLEX) 4 MG tablet Take 1 tablet (4 mg total) by mouth every 8 (eight) hours as needed for muscle spasms. 08/07/22  Yes Zenia Resides, MD  azelastine (ASTELIN) 0.1 % nasal spray 1-2 sprays each nostril twice daily as needed 03/04/22   Julieanne Manson, MD  EPINEPHrine 0.3 mg/0.3 mL IJ SOAJ injection Inject 0.3 mg into the muscle as needed for anaphylaxis. 03/04/22   Julieanne Manson, MD    Family History Family History  Problem Relation Age of Onset   Hypertension Mother    Other Mother        Prediabetes   Hyperlipidemia Father    Other Father  Prediabetes   Anxiety disorder Sister    Asthma Brother    Heart attack Maternal Grandmother    Lung cancer Paternal Grandmother     Social History Social History   Tobacco Use   Smoking status: Never   Smokeless tobacco: Never  Vaping Use   Vaping status: Never Used  Substance Use Topics   Alcohol use: Yes    Comment: Rare-special occasions   Drug use: No     Allergies   Patient has no known allergies.   Review of Systems Review of Systems  Cardiovascular:  Positive for chest pain.  All other systems reviewed and are negative.    Physical Exam Triage Vital Signs ED Triage Vitals  Encounter Vitals Group     BP 10/27/23 1501 102/61     Systolic BP Percentile --      Diastolic BP Percentile --      Pulse Rate 10/27/23  1501 88     Resp 10/27/23 1501 18     Temp 10/27/23 1501 99.8 F (37.7 C)     Temp Source 10/27/23 1501 Oral     SpO2 10/27/23 1501 100 %     Weight --      Height --      Head Circumference --      Peak Flow --      Pain Score 10/27/23 1503 6     Pain Loc --      Pain Education --      Exclude from Growth Chart --    No data found.  Updated Vital Signs BP 102/61 (BP Location: Right Arm)   Pulse 88   Temp 99.8 F (37.7 C) (Oral)   Resp 18   SpO2 100%   Visual Acuity Right Eye Distance:   Left Eye Distance:   Bilateral Distance:    Right Eye Near:   Left Eye Near:    Bilateral Near:     Physical Exam Vitals and nursing note reviewed.  Constitutional:      Appearance: She is well-developed.  HENT:     Head: Normocephalic.  Cardiovascular:     Rate and Rhythm: Normal rate.     Heart sounds: Normal heart sounds.  Pulmonary:     Effort: Pulmonary effort is normal.     Breath sounds: Normal breath sounds.  Abdominal:     General: There is no distension.  Musculoskeletal:        General: Normal range of motion.     Cervical back: Normal range of motion.  Skin:    General: Skin is warm.  Neurological:     Mental Status: She is alert and oriented to person, place, and time.      UC Treatments / Results  Labs (all labs ordered are listed, but only abnormal results are displayed) Labs Reviewed - No data to display  EKG   Radiology DG Chest 2 View  Result Date: 10/27/2023 CLINICAL DATA:  Cough and chest pain. EXAM: CHEST - 2 VIEW COMPARISON:  None Available. FINDINGS: The heart size and mediastinal contours are within normal limits. Both lungs are clear. The visualized skeletal structures are unremarkable. IMPRESSION: No active cardiopulmonary disease. Electronically Signed   By: Elgie Collard M.D.   On: 10/27/2023 18:59    Procedures Procedures (including critical care time)  Medications Ordered in UC Medications - No data to display  Initial  Impression / Assessment and Plan / UC Course  I have reviewed the triage vital signs  and the nursing notes.  Pertinent labs & imaging results that were available during my care of the patient were reviewed by me and considered in my medical decision making (see chart for details).     MDM chest x-ray appears normal EKG shows normal sinus normal EKG.  I looked at patient's mask.  It has a very dirty appearing filter.  I advised patient she should probably not wear this mask and breathe through the dirty filter.  I advised her to take Tylenol for chest discomfort return if any problems. Final Clinical Impressions(s) / UC Diagnoses   Final diagnoses:  Nonspecific chest pain     Discharge Instructions      Tylenol for discomfort.  Follow up with your Physician for recheck.    ED Prescriptions   None    PDMP not reviewed this encounter. An After Visit Summary was printed and given to the patient.       Elson Areas, [place] 10/27/23 9528

## 2023-10-29 ENCOUNTER — Telehealth (HOSPITAL_COMMUNITY): Payer: Self-pay | Admitting: Emergency Medicine

## 2023-12-21 ENCOUNTER — Ambulatory Visit (HOSPITAL_COMMUNITY)
Admission: EM | Admit: 2023-12-21 | Discharge: 2023-12-21 | Disposition: A | Discharge status: Home / Self Care | Payer: Medicare HMO | Attending: Family Medicine | Admitting: Family Medicine

## 2023-12-21 ENCOUNTER — Encounter (HOSPITAL_COMMUNITY): Payer: Self-pay | Admitting: Emergency Medicine

## 2023-12-21 DIAGNOSIS — J069 Acute upper respiratory infection, unspecified: Secondary | ICD-10-CM | POA: Diagnosis not present

## 2023-12-21 MED ORDER — BENZONATATE 100 MG PO CAPS: 100.0000 mg | ORAL_CAPSULE | Freq: Three times a day (TID) | ORAL | 0 refills | Status: DC | PRN

## 2023-12-21 NOTE — ED Provider Notes (Signed)
[facility]    CSN: [id number] Arrival date & time: 12/21/23  0857      History   Chief Complaint Chief Complaint  Patient presents with   Cough   Sore Throat    HPI Sylvia [name] is a 27 y.o. female.    Cough Sore Throat   Here for scratchy throat, cough, congestion and rhinorrhea. Symptoms began on December 24.  No fever or chills.  No vomiting or diarrhea.  No shortness of breath. She states her throat burns some when she breathes in and her chest hurts sometimes when she coughs.  No allergies to medication  Last menstrual cycle was December 3   Past Medical History:  Diagnosis Date   ADHD    Anxiety    [name] is her counselor at [facility] [place].  [name] prescribes her mental health meds.   Autism spectrum    High functioning   Depression    [name] at [facility] of the [facility]    Patient Active Problem List   Diagnosis Date Noted   ADHD 03/04/2022   Environmental allergies 10/02/2018   Depression    Autism spectrum    Autism spectrum disorder 12/23/2013   Anxiety state 12/23/2013   Behavior disturbance 12/23/2013   Outbursts of anger 12/23/2013   Tremor 12/23/2013    Past Surgical History:  Procedure Laterality Date   EYE SURGERY  2001   strabismus corrective surgery   WISDOM TOOTH EXTRACTION  2012    OB History   No obstetric history on file.      Home Medications    Prior to Admission medications   Medication Sig Start Date End Date Taking? Authorizing Provider  benzonatate (TESSALON) 100 MG capsule Take 1 capsule (100 mg total) by mouth 3 (three) times daily as needed for cough. 12/21/23  Yes Zenia Resides, MD  azelastine (ASTELIN) 0.1 % nasal spray 1-2 sprays each nostril twice daily as needed Patient not taking: Reported on 12/21/2023 03/04/22   Julieanne Manson, MD  cloNIDine (CATAPRES) 0.1 MG tablet 1 tab by mouth at bedtime nightly 03/04/22   Julieanne Manson, MD  EPINEPHrine 0.3 mg/0.3  mL IJ SOAJ injection Inject 0.3 mg into the muscle as needed for anaphylaxis. 03/04/22   Julieanne Manson, MD  escitalopram (LEXAPRO) 20 MG tablet TAKE 1 TABLET(20 MG) BY MOUTH AT BEDTIME 04/07/23   Julieanne Manson, MD  fluticasone [facility]) 50 MCG/ACT nasal spray 2 sprays each nostril once daily. 03/04/22   Julieanne Manson, MD  levocetirizine (XYZAL) 5 MG tablet Take 1 tablet (5 mg total) by mouth every evening. 03/04/22   Julieanne Manson, MD  magnesium oxide (MAG-OX) 400 (240 Mg) MG tablet Take 1 tablet by mouth daily.    [provider]  mometasone (ELOCON) 0.1 % cream 1 application to affected areas twice daily as needed. 03/04/22   Julieanne Manson, MD  rizatriptan (MAXALT) 5 MG tablet Take 5 mg by mouth as needed.    [provider]    Family History Family History  Problem Relation Age of Onset   Hypertension Mother    Other Mother        Prediabetes   Hyperlipidemia Father    Other Father        Prediabetes   Anxiety disorder Sister    Asthma Brother    Heart attack Maternal Grandmother    Lung cancer Paternal Grandmother     Social History Social History   Tobacco Use  Smoking status: Never   Smokeless tobacco: Never  Vaping Use   Vaping status: Never Used  Substance Use Topics   Alcohol use: Yes    Comment: Rare-special occasions   Drug use: No     Allergies   Patient has no known allergies.   Review of Systems Review of Systems  Respiratory:  Positive for cough.      Physical Exam Triage Vital Signs ED Triage Vitals  Encounter Vitals Group     BP 12/21/23 0911 110/73     Systolic BP Percentile --      Diastolic BP Percentile --      Pulse Rate 12/21/23 0911 88     Resp 12/21/23 0911 17     Temp 12/21/23 0911 98.4 F (36.9 C)     Temp Source 12/21/23 0911 Oral     SpO2 12/21/23 0911 98 %     Weight --      Height --      Head Circumference --      Peak Flow --      Pain Score 12/21/23 0910 7     Pain Loc --       Pain Education --      Exclude from Growth Chart --    No data found.  Updated Vital Signs BP 110/73 (BP Location: Right Arm)   Pulse 88   Temp 98.4 F (36.9 C) (Oral)   Resp 17   LMP 11/28/2023 (Approximate)   SpO2 98%   Visual Acuity Right Eye Distance:   Left Eye Distance:   Bilateral Distance:    Right Eye Near:   Left Eye Near:    Bilateral Near:     Physical Exam Vitals reviewed.  Constitutional:      General: She is not in acute distress.    Appearance: She is not toxic-appearing.  HENT:     Right Ear: Tympanic membrane and ear canal normal.     Left Ear: Tympanic membrane and ear canal normal.     Nose: Congestion present.     Mouth/Throat:     Mouth: Mucous membranes are moist.     Comments: There is some clear mucus and some very mild erythema of the oropharynx.  No asymmetry Eyes:     Extraocular Movements: Extraocular movements intact.     Conjunctiva/sclera: Conjunctivae normal.     Pupils: Pupils are equal, round, and reactive to light.  Cardiovascular:     Rate and Rhythm: Normal rate and regular rhythm.     Heart sounds: No murmur heard. Pulmonary:     Effort: Pulmonary effort is normal. No respiratory distress.     Breath sounds: No wheezing, rhonchi or rales.  Chest:     Chest wall: No tenderness.  Musculoskeletal:     Cervical back: Neck supple.  Lymphadenopathy:     Cervical: No cervical adenopathy.  Skin:    Capillary Refill: Capillary refill takes less than 2 seconds.     Coloration: Skin is not jaundiced or pale.  Neurological:     General: No focal deficit present.     Mental Status: She is alert and oriented to person, place, and time.  Psychiatric:        Behavior: Behavior normal.      UC Treatments / Results  Labs (all labs ordered are listed, but only abnormal results are displayed) Labs Reviewed  SARS CORONAVIRUS 2 (TAT 6-24 HRS)    EKG   Radiology No results found.  Procedures Procedures (including  critical care time)  Medications Ordered in UC Medications - No data to display  Initial Impression / Assessment and Plan / UC Course  I have reviewed the triage vital signs and the nursing notes.  Pertinent labs & imaging results that were available during my care of the patient were reviewed by me and considered in my medical decision making (see chart for details).     Tessalon Perles are sent in for the cough. COVID swab is done and we will notify if positive, so they know they need to isolate.  Final Clinical Impressions(s) / UC Diagnoses   Final diagnoses:  Viral upper respiratory tract infection     Discharge Instructions      Take benzonatate 100 mg, 1 tab every 8 hours as needed for cough.   You have been swabbed for COVID, and the test will result in the next 24 hours. Our staff will call you if positive. If the COVID test is positive, you should quarantine until you are fever free for 24 hours and you are starting to feel better, and then take added precautions for the next 5 days, such as physical distancing/wearing a mask and good hand hygiene/washing.      ED Prescriptions     Medication Sig Dispense Auth. Provider   benzonatate (TESSALON) 100 MG capsule Take 1 capsule (100 mg total) by mouth 3 (three) times daily as needed for cough. 21 capsule Zenia Resides, MD      PDMP not reviewed this encounter.   Zenia Resides, MD 12/21/23 [phone]

## 2023-12-21 NOTE — ED Triage Notes (Signed)
Pt c/o sore throat, cough, sneezing, loss of appetite, and fatigue that began yesterday.

## 2023-12-21 NOTE — Discharge Instructions (Signed)
Take benzonatate 100 mg, 1 tab every 8 hours as needed for cough.   You have been swabbed for COVID, and the test will result in the next 24 hours. Our staff will call you if positive. If the COVID test is positive, you should quarantine until you are fever free for 24 hours and you are starting to feel better, and then take added precautions for the next 5 days, such as physical distancing/wearing a mask and good hand hygiene/washing.  

## 2023-12-22 ENCOUNTER — Telehealth (HOSPITAL_COMMUNITY): Payer: Self-pay

## 2023-12-22 LAB — SARS CORONAVIRUS 2 (TAT 6-24 HRS): SARS Coronavirus 2: NEGATIVE

## 2023-12-22 NOTE — Telephone Encounter (Signed)
Informed by the front desk: "The pt. above is calling in regards to Covid Test Results? She was tested on 12/26, best contact number [phone]"  Patient informed that COVID-19 testing was negative. Patient verbalized understanding

## 2024-02-06 ENCOUNTER — Encounter: Payer: Self-pay | Admitting: Internal Medicine

## 2024-02-06 ENCOUNTER — Ambulatory Visit (INDEPENDENT_AMBULATORY_CARE_PROVIDER_SITE_OTHER): Payer: Medicare HMO | Admitting: Internal Medicine

## 2024-02-06 VITALS — BP 110/70 | HR 79 | Resp 18 | Ht 61.0 in | Wt 157.0 lb

## 2024-02-06 DIAGNOSIS — L600 Ingrowing nail: Secondary | ICD-10-CM | POA: Diagnosis not present

## 2024-02-06 DIAGNOSIS — N946 Dysmenorrhea, unspecified: Secondary | ICD-10-CM

## 2024-02-06 DIAGNOSIS — N920 Excessive and frequent menstruation with regular cycle: Secondary | ICD-10-CM

## 2024-02-06 DIAGNOSIS — L84 Corns and callosities: Secondary | ICD-10-CM | POA: Diagnosis not present

## 2024-02-06 MED ORDER — NORGESTIMATE-ETH ESTRADIOL 0.25-35 MG-MCG PO TABS: 1.0000 | ORAL_TABLET | Freq: Every day | ORAL | 3 refills | Status: AC

## 2024-02-06 NOTE — Progress Notes (Signed)
    Subjective:    Patient ID: Sylvia [name], female   DOB: 1996-02-16, 28 y.o.   MRN: [id number]   HPI   Vertigo:  no longer an issue  2.  Problems with periods:  Had period starting the first week of January.  Lasted 6-7 days, which is typical for her.  Had a second period starting Jan 30th through Feb 5th or 6th.  Both episodes of flow were heavy with cramping.  She does have heavy bleeding and cramping regularly with periods.  Has been told she was low on iron at another office perhaps in 2023.  Has always had heavy periods with cramping.  Last hemoglobin here in 2022 was normal at 12.7.    3.  Right great toe with lesions and mom brings up intermittent swelling about lateral aspects of great toenail with pustular discharge.  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     Current Meds  Medication Sig   cloNIDine  (CATAPRES ) 0.1 MG tablet 1 tab by mouth at bedtime nightly   EPINEPHrine  0.3 mg/0.3 mL IJ SOAJ injection Inject 0.3 mg into the muscle as needed for anaphylaxis.   escitalopram  (LEXAPRO ) 20 MG tablet TAKE 1 TABLET(20 MG) BY MOUTH AT BEDTIME   fluticasone  (FLONASE ) 50 MCG/ACT nasal spray 2 sprays each nostril once daily.   levocetirizine (XYZAL ) 5 MG tablet Take 1 tablet (5 mg total) by mouth every evening.   magnesium oxide (MAG-OX) 400 (240 Mg) MG tablet Take 1 tablet by mouth daily.   mometasone  (ELOCON ) 0.1 % cream 1 application to affected areas twice daily as needed.   rizatriptan (MAXALT) 5 MG tablet Take 5 mg by mouth as needed.   No Known Allergies   Review of Systems    Objective:   BP 110/70 (BP Location: Right Arm, Patient Position: Sitting, Cuff Size: Normal)   Pulse 79   Resp 18   Ht 5' 1 (1.549 m)   Wt 157 lb (71.2 kg)   LMP 01/25/2024 (Approximate)   SpO2 99%   BMI 29.66 kg/m   Physical Exam NAD HEENT:  Palpebral conjunctivae with good color Lungs:  CTA CV:  RRR without murmur or rub  Radial and DP pulses  normal and equal Abd:  S, NT, + BS Right great toe with mild swelling along bilateral border of nail bed.  No erythema or pustular material currently.  Mildly tender to palpation.     Assessment & Plan   Vertigo :  resolved  2.  Menorrhagia and dysmenorrhea:  Check TSH and CBC.  Sprintec 28 days and follow up in about 4 months to see if period improved.    3.  Probable ingrowing toenail and callusing due to work boots:  recommend [employer] and [employer] shoes, pedicures to cut nails appropriately.  Call if does not improve.  Currently without significant findings.

## 2024-02-07 LAB — CBC WITH DIFFERENTIAL/PLATELET
Basophils Absolute: 0 10*3/uL (ref 0.0–0.2)
Basos: 1 %
EOS (ABSOLUTE): 0.1 10*3/uL (ref 0.0–0.4)
Eos: 2 %
Hematocrit: 38 % (ref 34.0–46.6)
Hemoglobin: 12.3 g/dL (ref 11.1–15.9)
Immature Grans (Abs): 0 10*3/uL (ref 0.0–0.1)
Immature Granulocytes: 0 %
Lymphocytes Absolute: 1.4 10*3/uL (ref 0.7–3.1)
Lymphs: 30 %
MCH: 31.6 pg (ref 26.6–33.0)
MCHC: 32.4 g/dL (ref 31.5–35.7)
MCV: 98 fL — ABNORMAL HIGH (ref 79–97)
Monocytes Absolute: 0.4 10*3/uL (ref 0.1–0.9)
Monocytes: 9 %
Neutrophils Absolute: 2.6 10*3/uL (ref 1.4–7.0)
Neutrophils: 58 %
Platelets: 279 10*3/uL (ref 150–450)
RBC: 3.89 x10E6/uL (ref 3.77–5.28)
RDW: 12.9 % (ref 11.7–15.4)
WBC: 4.5 10*3/uL (ref 3.4–10.8)

## 2024-02-07 LAB — TSH: TSH: 0.853 u[IU]/mL (ref 0.450–4.500)

## 2024-02-11 ENCOUNTER — Encounter: Payer: Self-pay | Admitting: Internal Medicine

## 2024-03-31 ENCOUNTER — Other Ambulatory Visit: Payer: Self-pay | Admitting: Internal Medicine

## 2024-06-24 ENCOUNTER — Encounter: Payer: Self-pay | Admitting: Internal Medicine

## 2024-06-24 ENCOUNTER — Ambulatory Visit (INDEPENDENT_AMBULATORY_CARE_PROVIDER_SITE_OTHER): Payer: 59 | Admitting: Internal Medicine

## 2024-06-24 VITALS — BP 110/70 | HR 60 | Resp 16 | Ht 60.25 in | Wt 153.5 lb

## 2024-06-24 DIAGNOSIS — N921 Excessive and frequent menstruation with irregular cycle: Secondary | ICD-10-CM | POA: Insufficient documentation

## 2024-06-24 DIAGNOSIS — N946 Dysmenorrhea, unspecified: Secondary | ICD-10-CM | POA: Diagnosis not present

## 2024-06-24 MED ORDER — MAGNESIUM OXIDE -MG SUPPLEMENT 400 (240 MG) MG PO TABS: 1.0000 | ORAL_TABLET | Freq: Every day | ORAL | 11 refills | Status: DC

## 2024-06-24 MED ORDER — RIZATRIPTAN BENZOATE 5 MG PO TABS: 5.0000 mg | ORAL_TABLET | ORAL | 11 refills | Status: AC | PRN

## 2024-06-24 NOTE — Progress Notes (Signed)
    Subjective:    Patient ID: Sylvia [name], female   DOB: 1996-12-02, 28 y.o.   MRN: [id number]   HPI  Menorrhagia and dysmenorrhea:  states her periods are actually irregular as well.  She did not start the BCPs as her mother did not want her start as they received information that her maternal grandmother may have had a DVT or PE.  She died after giving birth to her 6th child in her early 59s.  .  She apparently collapsed on the street.  Her mother states she died from a heart attack, but not clear when the possible DVTs or PE occurred.   Grandfather is not a good historian according to her mom and her aunt was only 80 when grandmother died suddenly in early 88s.  Not clear much of this is speculation or not.  Grandmother was obese by mother's description.  Reportedly, grandmother was taking BCPs at the time as well.    Needs meds for migraines refilled:  Mag oxide and Maxalt.  Has appt with allergist soon for refills.    Current Meds  Medication Sig   cloNIDine  (CATAPRES ) 0.1 MG tablet 1 tab by mouth at bedtime nightly   EPINEPHrine  0.3 mg/0.3 mL IJ SOAJ injection Inject 0.3 mg into the muscle as needed for anaphylaxis.   escitalopram  (LEXAPRO ) 20 MG tablet TAKE 1 TABLET(20 MG) BY MOUTH AT BEDTIME   fluticasone  (FLONASE ) 50 MCG/ACT nasal spray 2 sprays each nostril once daily.   levocetirizine (XYZAL ) 5 MG tablet Take 1 tablet (5 mg total) by mouth every evening.   magnesium oxide (MAG-OX) 400 (240 Mg) MG tablet Take 1 tablet by mouth daily.   rizatriptan (MAXALT) 5 MG tablet Take 5 mg by mouth as needed.   No Known Allergies   Review of Systems    Objective:   BP 110/70 (BP Location: Left Arm, Patient Position: Sitting, Cuff Size: Normal)   Pulse 60   Resp 16   Ht 5' 0.25 (1.53 m)   Wt 153 lb 8 oz (69.6 kg)   LMP 06/17/2024   BMI 29.73 kg/m   Physical Exam NAD Bulbar conjunctivae, lips, palms all with good color   Assessment & Plan   Menorrhagia with  irregular periods and dysmenorrhea:  Discussed treatment would be hormonal unless she really is at higher risk for DVT.  Not clear the family truly knows her maternal grandmother's history, she was morbidly obese and there may be just much speculation.  Asked them to see if they can obtain more specific information and let me know.   Also, in the future, if she decides not to take a med, to notify right away to discuss rather than waiting for 4 months.  2.  Migraines:  Mag oxide and Maxalt

## 2024-08-13 ENCOUNTER — Telehealth: Payer: Self-pay | Admitting: Internal Medicine

## 2024-08-13 NOTE — Telephone Encounter (Signed)
 Patient's mother called today and states that patient needs an appointment for , patient's left knee is swelling since last Friday, patient does have to bend a lot at work to [employer] from the bottom. Mother states patient's knee is swollen and hot to touch. Patient has been taking aleve  .  Mother would like to know if patient can not be seen today or tomorrow, if she can take her to the urgent care

## 2024-08-14 NOTE — Telephone Encounter (Signed)
 Called patient to offer appointment, patient did not take due to work .

## 2024-08-15 NOTE — Telephone Encounter (Signed)
 Called patient to offer appointment, patient did not take due to work.  Patient would like to get if possible a week in advance.

## 2024-08-16 DIAGNOSIS — S8002XA Contusion of left knee, initial encounter: Secondary | ICD-10-CM | POA: Insufficient documentation

## 2024-08-16 DIAGNOSIS — W01198S Fall on same level from slipping, tripping and stumbling with subsequent striking against other object, sequela: Secondary | ICD-10-CM | POA: Insufficient documentation

## 2024-08-16 NOTE — Telephone Encounter (Signed)
 Patient has been scheduled

## 2024-08-20 ENCOUNTER — Ambulatory Visit: Admitting: Internal Medicine

## 2024-08-20 VITALS — BP 102/80 | HR 80 | Resp 18 | Ht 60.0 in | Wt 158.0 lb

## 2024-08-20 DIAGNOSIS — S8001XA Contusion of right knee, initial encounter: Secondary | ICD-10-CM | POA: Insufficient documentation

## 2024-08-20 DIAGNOSIS — M222X2 Patellofemoral disorders, left knee: Secondary | ICD-10-CM | POA: Insufficient documentation

## 2024-08-20 DIAGNOSIS — W01198S Fall on same level from slipping, tripping and stumbling with subsequent striking against other object, sequela: Secondary | ICD-10-CM

## 2024-08-20 DIAGNOSIS — S8002XD Contusion of left knee, subsequent encounter: Secondary | ICD-10-CM

## 2024-08-20 MED ORDER — MAGNESIUM OXIDE -MG SUPPLEMENT 400 (240 MG) MG PO TABS: 1.0000 | ORAL_TABLET | Freq: Every day | ORAL | 11 refills | Status: AC

## 2024-08-20 NOTE — Progress Notes (Unsigned)
 H/P by me initally, then presented to Dr. Adella.  HPI On 08/15/24:  Works at [employer], around 330pm, she was trying to get some plastic crates, and tripped over a wooden palat, then landed on both knees and then her right hip. Knees hit a grated metal floor. She was able to get up and walk. Sustained a 2cm abrasion to the right knee.   Saw a doctor at [facility] [address], [place], [place] [address].  Xray of both knees and right hip were neg  per copy of reports the patient has with her.    She was to return to work on 08/16/24 with restrictions until 08/23/24 according to the Discharge paper the patient has with her.  She is to go back to the Urgent Care on 08/23/24 for follow-up.   The reason for this visit: Before the fall, her left knee was hurting about 2 weeks.   At work, she does a lot of squatting, deep knee bending on the floor but not contacting the floor. No acute trauma prior.. Gradual onset.  It hurts with bending, walking. Is able to sleep at night. The front part, below the knee cap hurts the most and radiates all around B to the back along the joint space.  No hip/ankle/feet.  Elbow/shoulder/wrists/back OK. Normal gait. Employer was not able to find her light duty and she is off for the week until 08/23/24. Self prescribed Aleve  220mg  with breakfast and lunch for a week.  Current Meds  Medication Sig   cloNIDine  (CATAPRES ) 0.1 MG tablet 1 tab by mouth at bedtime nightly   EPINEPHrine  0.3 mg/0.3 mL IJ SOAJ injection Inject 0.3 mg into the muscle as needed for anaphylaxis.   escitalopram  (LEXAPRO ) 20 MG tablet TAKE 1 TABLET(20 MG) BY MOUTH AT BEDTIME   fluticasone  (FLONASE ) 50 MCG/ACT nasal spray 2 sprays each nostril once daily.   levocetirizine (XYZAL ) 5 MG tablet Take 1 tablet (5 mg total) by mouth every evening.   naproxen  sodium (ALEVE ) 220 MG tablet Take 220 mg by mouth in the morning and at bedtime.   rizatriptan  (MAXALT ) 5 MG tablet Take 1 tablet (5 mg total) by  mouth as needed.   [DISCONTINUED] magnesium  oxide (MAG-OX) 400 (240 Mg) MG tablet Take 1 tablet (400 mg total) by mouth daily.    No Known Allergies  ROS-  No acute trauma Workmans comp  PEX- Today's Vitals   08/20/24 1448  BP: 102/80  Pulse: 80  Resp: 18  Weight: 158 lb (71.7 kg)  Height: 5' (1.524 m)   Body mass index is 30.86 kg/m.   Physical Exam Vitals and nursing note reviewed.  Constitutional:      General: She is not in acute distress.    Appearance: Normal appearance. She is not ill-appearing, toxic-appearing or diaphoretic.  HENT:     Head: Normocephalic and atraumatic.     Ears:     Comments: Grossly normal hearing    Nose:     Comments: Appear normal without rhinorrhea or stuffiness Eyes:     Comments: Appears normal  Cardiovascular:     Comments: DP normal B. Cap refill brisk B feet.  Pulmonary:     Effort: Pulmonary effort is normal.  Musculoskeletal:        General: Tenderness and signs of injury present. No swelling or deformity.     Cervical back: Normal range of motion.       Legs:  Comments: Left leg: +tender lower 1/3 of the left patella and patellar tendon. No swelling . FROM.  Negative ant, post drawer sign, neg lockmans.  +increase in pain with valgus stress on the patella and lateral aspect of the knee.  +tenderness on the joint lines medially and laterally but not poseteriorly. Negative Hoffa's fat pad tenderness. No effusion or swelling or increase in warmth.  No quad tendon tenderness.   Full ROM of the left hip and ankle.   Tenderness of the sides of left patella with pain when Dr. Adella moved the patella in the patellar tract.   Right knee- 2cm superficial abrasion and blue bruise . Full ROM. Neg ant/post drawer signs. Neg lockmans. No pain with varus and valgus stress. No tenderness of patellar or quad tendons. Full ROM at knee, ankle.  Normal gait. Able to walk on heel and tip-toes without difficulty  Skin:    Capillary Refill:  Capillary refill takes less than 2 seconds.     Comments: Small abrasion about 2 cm on left knee with blue bruise around it. No redness, drainage  Neurological:     Mental Status: She is alert and oriented to person, place, and time.     Motor: No weakness.     Comments: Strength 5/5 toe dorsi flexion, plantar flexion, ankle dorsiflex. Sensation intact on the B web spaces, lateral/medial aspect of both feet and lateral/medial of both legs and top of the injury  Psychiatric:        Mood and Affect: Mood normal.        Behavior: Behavior normal.        Thought Content: Thought content normal.        Judgment: Judgment normal.   Patellofemoral syndrome, left Aleve  PRN, decrease deep kneeling at work , recommending physcial therapy to learn improved ways of bending over and squatting ; Check with the Urgent Care to see if Workman's comp is covered and to let us  know. If not we can send it through her Medicare.   S/P Trip and Fall at work 08/15/2024 with B knee and right hip contusion- seen at outside urgent care, with negative xrays and f/u with them 08/23/24.    Physical therapy. Check with Urgent Care to see if workman's care is covered. If not we can send it through Medicare.

## 2024-08-20 NOTE — Assessment & Plan Note (Signed)
 Aleve  PRN, decrease deep kneeling at work

## 2024-08-20 NOTE — Patient Instructions (Signed)
 Concern for femora/patellar syndrome with repeated deep knee bending on left unrelated to fall and injury, but occurring with her work prior.   Would like to know if could be sent to PT to work on exercise program and learn how to squat/bend down to prevent recurrent injury. She is wearing a knee brace that helps

## 2024-09-26 ENCOUNTER — Other Ambulatory Visit: Payer: Self-pay

## 2024-10-08 ENCOUNTER — Telehealth: Payer: Self-pay

## 2024-10-08 ENCOUNTER — Other Ambulatory Visit: Payer: Self-pay

## 2024-10-08 MED ORDER — CLONIDINE HCL 0.1 MG PO TABS: ORAL_TABLET | ORAL | 11 refills | Status: AC

## 2024-11-07 ENCOUNTER — Encounter: Payer: Self-pay | Admitting: Internal Medicine
